# Patient Record
Sex: Female | Born: 1945
Health system: Southern US, Community
[De-identification: ages and names within clinical notes are randomized; demographics above are authoritative.]

## PROBLEM LIST (undated history)

## (undated) DIAGNOSIS — E785 Hyperlipidemia, unspecified: Secondary | ICD-10-CM

## (undated) DIAGNOSIS — IMO0001 Reserved for inherently not codable concepts without codable children: Secondary | ICD-10-CM

## (undated) DIAGNOSIS — E041 Nontoxic single thyroid nodule: Secondary | ICD-10-CM

## (undated) DIAGNOSIS — S069XAA Unspecified intracranial injury with loss of consciousness status unknown, initial encounter: Secondary | ICD-10-CM

## (undated) DIAGNOSIS — K219 Gastro-esophageal reflux disease without esophagitis: Secondary | ICD-10-CM

## (undated) DIAGNOSIS — E039 Hypothyroidism, unspecified: Secondary | ICD-10-CM

## (undated) DIAGNOSIS — F329 Major depressive disorder, single episode, unspecified: Secondary | ICD-10-CM

## (undated) DIAGNOSIS — F419 Anxiety disorder, unspecified: Secondary | ICD-10-CM

## (undated) DIAGNOSIS — Z820 Family history of epilepsy and other diseases of the nervous system: Secondary | ICD-10-CM

## (undated) DIAGNOSIS — J302 Other seasonal allergic rhinitis: Secondary | ICD-10-CM

## (undated) DIAGNOSIS — S069X9A Unspecified intracranial injury with loss of consciousness of unspecified duration, initial encounter: Secondary | ICD-10-CM

## (undated) DIAGNOSIS — J209 Acute bronchitis, unspecified: Secondary | ICD-10-CM

## (undated) DIAGNOSIS — Z9109 Other allergy status, other than to drugs and biological substances: Secondary | ICD-10-CM

## (undated) DIAGNOSIS — R0602 Shortness of breath: Secondary | ICD-10-CM

## (undated) DIAGNOSIS — F32A Depression, unspecified: Secondary | ICD-10-CM

## (undated) HISTORY — DX: Family history of epilepsy and other diseases of the nervous system: Z82.0

## (undated) HISTORY — DX: Hyperlipidemia, unspecified: E78.5

## (undated) HISTORY — DX: Other seasonal allergic rhinitis: J30.2

## (undated) HISTORY — DX: Depression, unspecified: F32.A

## (undated) HISTORY — DX: Reserved for inherently not codable concepts without codable children: IMO0001

## (undated) HISTORY — DX: Unspecified intracranial injury with loss of consciousness status unknown, initial encounter: S06.9XAA

## (undated) HISTORY — DX: Major depressive disorder, single episode, unspecified: F32.9

## (undated) HISTORY — PX: OTHER SURGICAL HISTORY: SHX169

## (undated) HISTORY — DX: Nontoxic single thyroid nodule: E04.1

## (undated) HISTORY — DX: Anxiety disorder, unspecified: F41.9

## (undated) HISTORY — PX: NASAL SINUS SURGERY: SHX719

## (undated) HISTORY — DX: Unspecified intracranial injury with loss of consciousness of unspecified duration, initial encounter: S06.9X9A

## (undated) HISTORY — DX: Hypothyroidism, unspecified: E03.9

## (undated) HISTORY — PX: TUBAL LIGATION: SHX77

---

## 1998-11-30 ENCOUNTER — Ambulatory Visit (HOSPITAL_COMMUNITY): Admission: RE | Admit: 1998-11-30 | Discharge: 1998-11-30 | Payer: Self-pay | Admitting: *Deleted

## 1998-11-30 ENCOUNTER — Encounter: Payer: Self-pay | Admitting: *Deleted

## 2000-01-27 ENCOUNTER — Ambulatory Visit (HOSPITAL_COMMUNITY): Admission: RE | Admit: 2000-01-27 | Discharge: 2000-01-27 | Payer: Self-pay | Admitting: *Deleted

## 2000-01-27 ENCOUNTER — Encounter: Payer: Self-pay | Admitting: *Deleted

## 2000-02-28 ENCOUNTER — Other Ambulatory Visit: Admission: RE | Admit: 2000-02-28 | Discharge: 2000-02-28 | Payer: Self-pay | Admitting: *Deleted

## 2001-03-05 ENCOUNTER — Encounter: Payer: Self-pay | Admitting: *Deleted

## 2001-03-05 ENCOUNTER — Ambulatory Visit (HOSPITAL_COMMUNITY): Admission: RE | Admit: 2001-03-05 | Discharge: 2001-03-05 | Payer: Self-pay | Admitting: *Deleted

## 2001-03-10 ENCOUNTER — Encounter: Admission: RE | Admit: 2001-03-10 | Discharge: 2001-03-10 | Payer: Self-pay | Admitting: *Deleted

## 2001-03-10 ENCOUNTER — Encounter: Payer: Self-pay | Admitting: *Deleted

## 2002-03-08 ENCOUNTER — Encounter: Admission: RE | Admit: 2002-03-08 | Discharge: 2002-03-08 | Payer: Self-pay | Admitting: *Deleted

## 2002-03-08 ENCOUNTER — Encounter: Payer: Self-pay | Admitting: *Deleted

## 2003-04-14 ENCOUNTER — Encounter: Payer: Self-pay | Admitting: *Deleted

## 2003-04-14 ENCOUNTER — Encounter: Admission: RE | Admit: 2003-04-14 | Discharge: 2003-04-14 | Payer: Self-pay | Admitting: *Deleted

## 2003-04-21 ENCOUNTER — Encounter: Payer: Self-pay | Admitting: *Deleted

## 2003-04-21 ENCOUNTER — Ambulatory Visit (HOSPITAL_COMMUNITY): Admission: RE | Admit: 2003-04-21 | Discharge: 2003-04-21 | Payer: Self-pay | Admitting: *Deleted

## 2005-06-09 ENCOUNTER — Ambulatory Visit (HOSPITAL_COMMUNITY): Admission: RE | Admit: 2005-06-09 | Discharge: 2005-06-09 | Payer: Self-pay | Admitting: *Deleted

## 2005-07-17 ENCOUNTER — Other Ambulatory Visit: Admission: RE | Admit: 2005-07-17 | Discharge: 2005-07-17 | Payer: Self-pay | Admitting: Obstetrics and Gynecology

## 2006-04-02 ENCOUNTER — Ambulatory Visit: Payer: Self-pay | Admitting: Internal Medicine

## 2006-04-09 ENCOUNTER — Encounter (INDEPENDENT_AMBULATORY_CARE_PROVIDER_SITE_OTHER): Payer: Self-pay | Admitting: Specialist

## 2006-04-09 ENCOUNTER — Ambulatory Visit: Payer: Self-pay | Admitting: Internal Medicine

## 2006-04-16 ENCOUNTER — Ambulatory Visit: Payer: Self-pay | Admitting: Pulmonary Disease

## 2006-04-17 ENCOUNTER — Ambulatory Visit: Payer: Self-pay | Admitting: Cardiology

## 2006-05-27 ENCOUNTER — Ambulatory Visit: Payer: Self-pay | Admitting: Internal Medicine

## 2006-06-05 ENCOUNTER — Ambulatory Visit: Payer: Self-pay | Admitting: Pulmonary Disease

## 2009-04-24 ENCOUNTER — Ambulatory Visit (HOSPITAL_COMMUNITY): Admission: RE | Admit: 2009-04-24 | Discharge: 2009-04-24 | Payer: Self-pay | Admitting: Obstetrics & Gynecology

## 2009-06-02 ENCOUNTER — Emergency Department (HOSPITAL_COMMUNITY): Admission: EM | Admit: 2009-06-02 | Discharge: 2009-06-02 | Payer: Self-pay | Admitting: Emergency Medicine

## 2010-12-20 LAB — BASIC METABOLIC PANEL
CO2: 23 mEq/L (ref 19–32)
Chloride: 104 mEq/L (ref 96–112)
GFR calc Af Amer: >60 mL/min (ref >60)
Glucose, Bld: 103 mg/dL — ABNORMAL HIGH (ref 70–99)
Potassium: 3.9 mEq/L (ref 3.5–5.1)
Sodium: 138 mEq/L (ref 135–145)

## 2010-12-20 LAB — DIFFERENTIAL
Basophils Absolute: 0 10*3/uL (ref 0.0–0.1)
Eosinophils Relative: 3 % (ref 0–5)
Lymphocytes Relative: 24 % (ref 12–46)
Lymphs Abs: 2.6 10*3/uL (ref 0.7–4.0)
Monocytes Absolute: 0.7 10*3/uL (ref 0.1–1.0)
Monocytes Relative: 6 % (ref 3–12)
Neutro Abs: 6.9 10*3/uL (ref 1.7–7.7)

## 2010-12-20 LAB — CBC
HCT: 44.8 % (ref 36.0–46.0)
Hemoglobin: 15.5 g/dL — ABNORMAL HIGH (ref 12.0–15.0)
MCHC: 34.7 g/dL (ref 30.0–36.0)
MCV: 93.5 fL (ref 78.0–100.0)
RBC: 4.8 MIL/uL (ref 3.87–5.11)
RDW: 12.5 % (ref 11.5–15.5)

## 2010-12-20 LAB — POCT CARDIAC MARKERS
CKMB, poc: <1 ng/mL — ABNORMAL LOW (ref 1.0–8.0)
Myoglobin, poc: 41.7 ng/mL (ref 12–200)
Myoglobin, poc: 56.1 ng/mL (ref 12–200)
Troponin i, poc: <0.05 ng/mL (ref 0.00–0.09)
Troponin i, poc: <0.05 ng/mL (ref 0.00–0.09)

## 2010-12-20 LAB — URINALYSIS, ROUTINE W REFLEX MICROSCOPIC
Bilirubin Urine: NEGATIVE
Nitrite: POSITIVE — AB
Protein, ur: NEGATIVE mg/dL
Urobilinogen, UA: 0.2 mg/dL (ref 0.0–1.0)

## 2010-12-20 LAB — CK TOTAL AND CKMB (NOT AT ARMC): Total CK: 66 U/L (ref 7–177)

## 2010-12-20 LAB — URINE MICROSCOPIC-ADD ON

## 2011-01-31 NOTE — Assessment & Plan Note (Signed)
[facility]                               PULMONARY OFFICE NOTE   NAME:[name], [name]                    MRN:          [id number]  DATE:04/16/2006                            DOB:          1946/02/14    REASON FOR CONSULTATION:  Cough.   This is a very pleasant 65 year old female kindly referred by Dr. Lina Sar because of cough, which was believed to be secondary to  gastroesophageal reflux.  Dr. Juanda Chance had done extensive workup to include  panendoscopy.  This was done on April 09, 2006.  The patient was noted to  have changes consistent with gastroesophageal reflux on endoscopic  examination.  The patient was placed on Protonix twice a day.  Since that  time, the patient states that she has noted dramatic improvement of her  cough.  She first noticed her cough around March of 2007 and noted that  things like warm tea and cough drops would make it better.  She also noted  that the cough happened at any time, but that anything can set it off and  that sometimes when she starts coughing, it is very difficult to control,  but again, since her endoscopy in July and since her Protonix was started  twice a day the patient has noted much improvement in her symptomatology.   The patient has seen Dr. Osborn Coho in the past for ear, nose, and  throat difficulties.  She has been diagnosed with allergies for  approximately 20-30 years.   PAST MEDICAL HISTORY:  Includes that of nasal polyps.  She has  gastroesophageal reflux as noted above.  She also has difficulties with  chronic environmental allergies for which she has been previously in  immunotherapy for.   PAST SURGICAL HISTORY:  Includes hysterectomy in 1996, tubal ligation in  1987.  Ovarian cyst removal in 1970.  She also has had manipulation of sinus  polyps, but no inpatient surgery for the same.   ALLERGIES:  None noted.  She has taken prednisone in the past for her  allergic  symptoms.  Last was in 1994.   CURRENT MEDICATIONS:  1. Lexapro 20 mg half tablet daily.  2. Estrace cream 2 times per week.  3. She is on Protonix 40 mg twice a day.  4. She uses also Gas Relief p.r.n.  5. The patient also takes Fioricet as needed.  6. Allegra as needed.  7. Tussionex as needed.  8. She is also on Flonase nasal spray.   SOCIAL HISTORY:  The patient has never smoked.  She lives with her husband.  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  She has had no exotic travel.   FAMILY HISTORY:  Noncontributory.   REVIEW OF SYSTEMS:  As noted in the history of present illness.  She does  have symptoms of anxiety and depression on occasion.  Otherwise, review of  systems is unremarkable.   PHYSICAL EXAMINATION:  Blood pressure is 100/60, pulse of 75, weight 141.5,  temperature 98.0.  Oxygen saturation of 97% on room air.  GENERAL:  This is a well-developed, well-nourished white female who is  ambulatory.  HEENT:  Examination is remarkable for nasal turbinate edema.  She has clear  nasal discharge with some crusting noted.  Pharynx is clear.  NECK:  Supple.  No adenopathy noted.  No JVD.  LUNGS:  Clear to auscultation bilaterally.  CARDIAC:  Regular rate and rhythm.  No rubs, murmurs, or gallops heard.  EXTREMITIES:  She has no cyanosis, clubbing, or edema noted.   We did perform spirometry, which showed that she basically had normal  spirometry with the exception of a very, very mild small airway component,  but this is almost insignificant.   IMPRESSION:  1. Cough, which I agree with Dr. Juanda Chance, likely is due to gastroesophageal      reflux with laryngopharyngeal component.  I do suspect, however, that      she has an element of chronic sinus disease and possibly some post-      nasal drip issues that may be aggravating her condition.  2. The patient has allergic rhinitis.  3. Gastroesophageal reflux with  laryngopharyngeal component as well.   PLAN:  1. We will give the patient a trial of Singulair 10 mg daily to see if      this helps with her chronic nasal symptoms.  2. Change Flonase to Veramyst 2 inhalations to each nostril daily.  3. Continue Protonix on a twice a day basis.  4. I will obtain a CT scan of the sinuses.  5. Followup will be in 4 to 6 weeks' time.  She is to contact us prior to      that time should any problems arise.                                   Gailen Shelter, MD   CLG/MedQ  DD:  06/05/2006  DT:  06/08/2006  Job #:  [id number]   cc:   Hedwig Morton. Juanda Chance, MD  Kinnie Scales Annalee Genta, M.D.

## 2011-01-31 NOTE — Assessment & Plan Note (Signed)
[facility]                               PULMONARY OFFICE NOTE   NAME:[name], [name]                    MRN:          [id number]  DATE:06/05/2006                            DOB:          06-12-1946    [name] is a very pleasant 65 year old female who follows here for  cough, first noticed around March of 2007 with subsequent endoscopic  evaluation by [name] in July of 2007, which was consistent with  gastroesophageal reflux.  The patient also has chronic nasal congestion.  The patient underwent a CT scan of the sinuses on April 17, 2006 and she was  noted to have severe pansinusitis and nasal polyps.  She presents today with  difficulties with frontal headaches for the last 2 days, which she is  calling a migraine.  She does state that her cough continues to do well.  Her Protonix has since been decreased to once daily by [name].  She has  had, however, some increased nasal congestion and mucopurulent discharge.  She denies any fevers, chills, or sweats.  By her description, her headaches  do not sound like a classic migraine, but I suspect are related to sinus  disease.  Given the severity of her disease on her CT scan, I am not  surprised that she is having a flare of sinusitis.   CURRENT MEDICATIONS:  As noted on the intake sheet.  These have been  appropriately inventoried and are correct.   PHYSICAL EXAM:  VITAL SIGNS:  As noted.  Oxygen saturation is 96% on room  air.  GENERAL:  This is a thin well-developed female who is in no acute distress.  She does sound quite nasally congested.  HEENT:  Examination reveals severe turbinate edema.  She has crusting about  the nares and purulent drainage.  She also has frontal sinus tenderness.  Tympanic membranes reveal serous otitis bilaterally.  Pharynx is clear.  LUNGS:  Clear.  CARDIAC:  Regular rate and rhythm.  No rubs, murmurs, or gallops heard.  EXTREMITIES:  No cyanosis,  clubbing, or edema noted.   IMPRESSION:  1. Acute on chronic exacerbation of chronic sinusitis.  The patient      appears to have active sinus infection at present.  2. Cough, which is improved and in part due to gastroesophageal reflux,      and part due to chronic nasal issues.   PLAN:  1. Will be to place the patient on Levaquin 750 mg daily x7 days.  2. The patient will be referred to [name] to evaluate her      chronic sinus disease.  3. She is to continue nasal hygiene, continue Veramyst, and start also      nasal saline irrigations.  4. Followup will be in 2 to 4 weeks' time.  She is to contact us prior to      that time should any problems arise.  Adriana Shelter, MD   CLG/MedQ  DD:  06/05/2006  DT:  06/08/2006  Job #:  [id number]   cc:   Adriana Hua L. Annalee [name], M.D.

## 2011-01-31 NOTE — Assessment & Plan Note (Signed)
[facility]                           GASTROENTEROLOGY OFFICE NOTE   NAME:[name], [name]                    MRN:          [id number]  DATE:04/02/2006                            DOB:          [birth year]    [name] is a very nice 65 year old white female. She is a Engineer, civil (consulting) with  [employer]. She has a chronic cough which bothers her during the  day and mostly at night.  She thinks this is gastroesophageal reflux based  on the evaluation by Dr. Annalee Genta about two years ago when she was  evaluated with sinus trouble.  He looked down her throat and told her that  she had changes of gastroesophageal reflux. She was put on Nexium 40 mg a  day which she took for about a year until her insurance denied it.  She  since then has been on Prilosec OTC 20 mg once or twice a day but has not  been satisfied with the results.  She denies any dysphagia or odynophagia.  Her weight has been stable.  She never smoked.  She had a chest x-ray last  month which apparently was normal.  She denies shortness of breath.  The  cough is not productive or productive of clear mucus.  The cough has  affected her at work.  The patient thinks that she may be having infection  and it bothers her.  She also has a lot of gas especially when she coughs.  She has no control over the flatus and that seems to be a problem as well.  The patient has mild constipation.  This was evaluated by Dr. Sherin Quarry  in  1998 when she underwent colonoscopy.  No polyps were found.  Her brother as  well as her son both have polyps. The son had a premalignant polyp.  She is  due for a repeat colonoscopy.   MEDICATIONS:  1.  Lexapro 20 mg p.o. daily.  2.  Estrace cream.  3.  Prilosec 20 mg OTC.  4.  She is also on Fioricet p.r.n.  5.  Allegra.  6.  Claritin.  7.  Tylenol ES.  8.  Cough drops.   PAST HISTORY:  1.  Significant for depression for three years.  2.  Allergy trouble.  3.   Migraine headaches.  4.  Anxiety.   OPERATIONS:  1.  Ovary and cyst removed.  2.  Sinus polyps removed by Dr. Annalee Genta.  3.  Tubal ligation.  4.  Hysterectomy in 1996.   FAMILY HISTORY:  Positive for heart disease in mother who also had a stroke.  Father had prostate cancer.   SOCIAL HISTORY:  She is married, having two children.  Has two years of  college. Works in physical therapy.  She does not smoke and does not drink  alcohol.   REVIEW OF SYSTEMS:  Positive for eyeglasses, allergies, arthritis, coughing,  leakage of urine, hearing problems.   PHYSICAL EXAMINATION:  VITAL SIGNS:  Blood pressure 112/58, pulse 78, weight  142 pounds.  GENERAL:  She was alert, oriented, in no distress.  Her voice was somewhat  hoarse or raspy.  She coughed every two or three minutes, a productive  cough, rather deep with audible rales.  HEENT:  Sclerae anicteric.  Oral cavity was normal with no thrush.  NECK:  Supple.  Thyroid not enlarged.  No adenopathy.  LUNGS:  Clear to auscultation.  There were no rales or wheezes although she  complains of occasional wheezing.  HEART:  Right precordium.  Normal S1 and S2.  ABDOMEN:  Soft, nontender with normoactive bowel sounds.  No distention, no  tympany.  Lower abdomen also unremarkable.  RECTAL:  Soft, Hemoccult-  negative stool.  EXTREMITIES:  No edema.   IMPRESSION:  65.  A 65 year old black female with productive cough and previous history of      gastroesophageal reflux disease.  The cough needs to be further      evaluated.  If it is due to reflux, she needs to be treated      aggressively.  If it is not due to reflux, she needs to see pulmonary to      rule out bronchiectasis or possibly some other pulmonary condition.  2.  Gastroesophageal reflux by history.  3.  Constipation.  Previous colonoscopy 10 years ago.  The patient due for      repeat colonoscopy because of family history of colon polyps in son and      father.   PLAN:  1.   Begin Protonix 40 mg p.o. b.i.d.  Samples given.  2.  Tussionex one teaspoon at bedtime, half teaspoon in the morning, to try      to suppress the cough.  3.  Refer to pulmonary for further evaluation.  4.  Upper endoscopy and colonoscopy scheduled.  The patient should stay on      antireflux measures.  She may even need further evaluation of her reflux      and send her for a pH probe but I am going to wait at this time until I      see her in six weeks.                                   [name]. Juanda Chance, MD   DMB/MedQ  DD:  04/02/2006  DT:  04/02/2006  Job #:  [id number]   cc:   [facility]

## 2011-01-31 NOTE — Assessment & Plan Note (Signed)
[facility]                           GASTROENTEROLOGY OFFICE NOTE   NAME:Glendening, Adriana GOODWILL                    MRN:          [id number]  DATE:05/27/2006                            DOB:          12-22-1945    [name] is a 65 year old white female with history of a cough,  hoarseness, and suspected gastroesophageal reflux disease.  On upper  endoscopy, her biopsies of the GE junction were completely normal.  There  was no Barrett's esophagus or esophagitis, although endoscopically there was  a question of mild irritation.  Histologically, there was no esophagitis.  The colonoscopy was negative.  She is about 80-90% improved after taking  Protonix 40 mg twice a day and Tussionex at night to curb her cough.  She  also saw Dr. Jayme Cloud, who obtained CT scan of her sinuses, and she has a  follow-up appointment with her this week to make a conclusion and final  disposition.  The patient is much improved.   PLAN:  We have discussed gastroesophageal reflux and I have suggested she  cut back her Protonix to 40 mg a day, and continue on Tussionex 1 teaspoon  at bedtime with plans to decrease it to p.r.n. dose only.  I will see her in  about 3 months.                                   Hedwig Morton. Juanda Chance, MD   DMB/MedQ  DD:  05/27/2006  DT:  05/27/2006  Job #:  [id number]   cc:   [facility]

## 2011-03-31 ENCOUNTER — Emergency Department (HOSPITAL_COMMUNITY): Payer: No Typology Code available for payment source

## 2011-03-31 ENCOUNTER — Inpatient Hospital Stay (HOSPITAL_COMMUNITY)
Admission: EM | Admit: 2011-03-31 | Discharge: 2011-04-14 | DRG: 957 | Disposition: A | Discharge status: Rehabilitation Facility | Payer: No Typology Code available for payment source | Attending: General Surgery | Admitting: General Surgery

## 2011-03-31 DIAGNOSIS — I712 Thoracic aortic aneurysm, without rupture, unspecified: Secondary | ICD-10-CM | POA: Diagnosis present

## 2011-03-31 DIAGNOSIS — S066X9A Traumatic subarachnoid hemorrhage with loss of consciousness of unspecified duration, initial encounter: Secondary | ICD-10-CM

## 2011-03-31 DIAGNOSIS — E7800 Pure hypercholesterolemia, unspecified: Secondary | ICD-10-CM | POA: Diagnosis present

## 2011-03-31 DIAGNOSIS — E876 Hypokalemia: Secondary | ICD-10-CM | POA: Diagnosis not present

## 2011-03-31 DIAGNOSIS — H8110 Benign paroxysmal vertigo, unspecified ear: Secondary | ICD-10-CM | POA: Diagnosis present

## 2011-03-31 DIAGNOSIS — S42009A Fracture of unspecified part of unspecified clavicle, initial encounter for closed fracture: Secondary | ICD-10-CM

## 2011-03-31 DIAGNOSIS — J9819 Other pulmonary collapse: Secondary | ICD-10-CM | POA: Diagnosis present

## 2011-03-31 DIAGNOSIS — S37009A Unspecified injury of unspecified kidney, initial encounter: Secondary | ICD-10-CM

## 2011-03-31 DIAGNOSIS — E039 Hypothyroidism, unspecified: Secondary | ICD-10-CM | POA: Diagnosis present

## 2011-03-31 DIAGNOSIS — S42023A Displaced fracture of shaft of unspecified clavicle, initial encounter for closed fracture: Secondary | ICD-10-CM | POA: Diagnosis present

## 2011-03-31 DIAGNOSIS — D62 Acute posthemorrhagic anemia: Secondary | ICD-10-CM | POA: Diagnosis present

## 2011-03-31 DIAGNOSIS — D72829 Elevated white blood cell count, unspecified: Secondary | ICD-10-CM | POA: Diagnosis present

## 2011-03-31 DIAGNOSIS — S82209A Unspecified fracture of shaft of unspecified tibia, initial encounter for closed fracture: Principal | ICD-10-CM | POA: Diagnosis present

## 2011-03-31 DIAGNOSIS — D696 Thrombocytopenia, unspecified: Secondary | ICD-10-CM | POA: Diagnosis not present

## 2011-03-31 DIAGNOSIS — S066X0A Traumatic subarachnoid hemorrhage without loss of consciousness, initial encounter: Secondary | ICD-10-CM | POA: Diagnosis present

## 2011-03-31 DIAGNOSIS — J189 Pneumonia, unspecified organism: Secondary | ICD-10-CM | POA: Diagnosis not present

## 2011-03-31 DIAGNOSIS — R21 Rash and other nonspecific skin eruption: Secondary | ICD-10-CM | POA: Diagnosis present

## 2011-03-31 DIAGNOSIS — S62123A Displaced fracture of lunate [semilunar], unspecified wrist, initial encounter for closed fracture: Secondary | ICD-10-CM | POA: Diagnosis present

## 2011-03-31 DIAGNOSIS — S82899A Other fracture of unspecified lower leg, initial encounter for closed fracture: Secondary | ICD-10-CM | POA: Diagnosis present

## 2011-03-31 DIAGNOSIS — Q619 Cystic kidney disease, unspecified: Secondary | ICD-10-CM

## 2011-03-31 DIAGNOSIS — J441 Chronic obstructive pulmonary disease with (acute) exacerbation: Secondary | ICD-10-CM | POA: Diagnosis not present

## 2011-03-31 DIAGNOSIS — S0100XA Unspecified open wound of scalp, initial encounter: Secondary | ICD-10-CM | POA: Diagnosis present

## 2011-03-31 DIAGNOSIS — H409 Unspecified glaucoma: Secondary | ICD-10-CM | POA: Diagnosis present

## 2011-03-31 LAB — URINALYSIS, ROUTINE W REFLEX MICROSCOPIC
Bilirubin Urine: NEGATIVE
Glucose, UA: 250 mg/dL — AB
Protein, ur: NEGATIVE mg/dL
Urobilinogen, UA: 0.2 mg/dL (ref 0.0–1.0)

## 2011-03-31 LAB — CBC
HCT: 45.1 % (ref 36.0–46.0)
HCT: 37.5 % (ref 36.0–46.0)
Hemoglobin: 15.7 g/dL — ABNORMAL HIGH (ref 12.0–15.0)
MCH: 32.2 pg (ref 26.0–34.0)
MCHC: 34.8 g/dL (ref 30.0–36.0)
MCV: 92.6 fL (ref 78.0–100.0)
MCV: 93.5 fL (ref 78.0–100.0)
Platelets: 112 K/uL — ABNORMAL LOW (ref 150–400)
Platelets: 106 10*3/uL — ABNORMAL LOW (ref 150–400)
RBC: 4.87 MIL/uL (ref 3.87–5.11)
RBC: 4.01 MIL/uL (ref 3.87–5.11)
RDW: 12.8 % (ref 11.5–15.5)
RDW: 13 % (ref 11.5–15.5)
WBC: 11.7 K/uL — ABNORMAL HIGH (ref 4.0–10.5)
WBC: 9.3 10*3/uL (ref 4.0–10.5)

## 2011-03-31 LAB — BASIC METABOLIC PANEL WITH GFR
BUN: 11 mg/dL (ref 6–23)
CO2: 22 meq/L (ref 19–32)
Calcium: 8.8 mg/dL (ref 8.4–10.5)
Chloride: 104 meq/L (ref 96–112)
Creatinine, Ser: 0.94 mg/dL (ref 0.50–1.10)
GFR calc Af Amer: >60 mL/min
GFR calc non Af Amer: 60 mL/min — ABNORMAL LOW
Glucose, Bld: 122 mg/dL — ABNORMAL HIGH (ref 70–99)
Potassium: 4 meq/L (ref 3.5–5.1)
Sodium: 139 meq/L (ref 135–145)

## 2011-03-31 LAB — URINE MICROSCOPIC-ADD ON

## 2011-03-31 LAB — DIFFERENTIAL
Basophils Relative: 0 % (ref 0–1)
Eosinophils Absolute: 0 10*3/uL (ref 0.0–0.7)
Neutro Abs: 8.6 10*3/uL — ABNORMAL HIGH (ref 1.7–7.7)
Neutrophils Relative %: 74 % (ref 43–77)

## 2011-03-31 LAB — MRSA PCR SCREENING: MRSA by PCR: NEGATIVE

## 2011-03-31 LAB — PROTIME-INR: INR: 1.08 (ref 0.00–1.49)

## 2011-03-31 MED ORDER — IOHEXOL 300 MG/ML  SOLN
100.0000 mL | Freq: Once | INTRAMUSCULAR | Status: AC | PRN
  Administered 2011-03-31: 100 mL via INTRAVENOUS

## 2011-03-31 MED ORDER — IOHEXOL 300 MG/ML  SOLN
200.0000 mL | Freq: Once | INTRAMUSCULAR | Status: AC | PRN
  Administered 2011-03-31: 70 mL via INTRAVENOUS

## 2011-04-01 ENCOUNTER — Inpatient Hospital Stay (HOSPITAL_COMMUNITY): Payer: No Typology Code available for payment source

## 2011-04-01 LAB — CBC
HCT: 34.5 % — ABNORMAL LOW (ref 36.0–46.0)
HCT: 32.6 % — ABNORMAL LOW (ref 36.0–46.0)
Hemoglobin: 11.7 g/dL — ABNORMAL LOW (ref 12.0–15.0)
Hemoglobin: 10.9 g/dL — ABNORMAL LOW (ref 12.0–15.0)
MCH: 31.6 pg (ref 26.0–34.0)
MCHC: 33.9 g/dL (ref 30.0–36.0)
MCV: 94.5 fL (ref 78.0–100.0)
RBC: 3.45 MIL/uL — ABNORMAL LOW (ref 3.87–5.11)
RDW: 13.2 % (ref 11.5–15.5)
WBC: 7.5 10*3/uL (ref 4.0–10.5)
WBC: 6.9 10*3/uL (ref 4.0–10.5)

## 2011-04-01 LAB — COMPREHENSIVE METABOLIC PANEL
AST: 28 U/L (ref 0–37)
Albumin: 2.6 g/dL — ABNORMAL LOW (ref 3.5–5.2)
BUN: 9 mg/dL (ref 6–23)
Creatinine, Ser: 0.72 mg/dL (ref 0.50–1.10)
Total Protein: 5.1 g/dL — ABNORMAL LOW (ref 6.0–8.3)

## 2011-04-01 LAB — APTT: aPTT: 22 seconds — ABNORMAL LOW (ref 24–37)

## 2011-04-01 LAB — PROTIME-INR
INR: 1.09 (ref 0.00–1.49)
Prothrombin Time: 14.3 seconds (ref 11.6–15.2)
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
## 2011-04-02 ENCOUNTER — Inpatient Hospital Stay (HOSPITAL_COMMUNITY): Payer: No Typology Code available for payment source

## 2011-04-02 LAB — TYPE AND SCREEN
ABO/RH(D): B POS
Antibody Screen: NEGATIVE
Unit division: 0

## 2011-04-02 LAB — CBC
HCT: 29.5 % — ABNORMAL LOW (ref 36.0–46.0)
Hemoglobin: 10.1 g/dL — ABNORMAL LOW (ref 12.0–15.0)
MCH: 31.9 pg (ref 26.0–34.0)
MCHC: 34.2 g/dL (ref 30.0–36.0)
MCV: 93.1 fL (ref 78.0–100.0)
RDW: 13.2 % (ref 11.5–15.5)

## 2011-04-02 LAB — BLOOD GAS, ARTERIAL
Acid-base deficit: 3.7 mmol/L — ABNORMAL HIGH (ref 0.0–2.0)
Bicarbonate: 20.5 mEq/L (ref 20.0–24.0)
Patient temperature: 98.6
TCO2: 21.6 mmol/L (ref 0–100)
pH, Arterial: 7.379 (ref 7.350–7.400)

## 2011-04-02 LAB — BASIC METABOLIC PANEL
BUN: 4 mg/dL — ABNORMAL LOW (ref 6–23)
Creatinine, Ser: 0.59 mg/dL (ref 0.50–1.10)
GFR calc non Af Amer: >60 mL/min (ref >60)
Glucose, Bld: 119 mg/dL — ABNORMAL HIGH (ref 70–99)
Potassium: 3.3 mEq/L — ABNORMAL LOW (ref 3.5–5.1)

## 2011-04-03 ENCOUNTER — Inpatient Hospital Stay (HOSPITAL_COMMUNITY): Payer: No Typology Code available for payment source

## 2011-04-03 LAB — CBC
HCT: 29 % — ABNORMAL LOW (ref 36.0–46.0)
Hemoglobin: 9.9 g/dL — ABNORMAL LOW (ref 12.0–15.0)
MCHC: 34.1 g/dL (ref 30.0–36.0)
MCV: 92.1 fL (ref 78.0–100.0)
WBC: 5.8 10*3/uL (ref 4.0–10.5)

## 2011-04-03 LAB — BASIC METABOLIC PANEL
BUN: 4 mg/dL — ABNORMAL LOW (ref 6–23)
Chloride: 110 mEq/L (ref 96–112)
Glucose, Bld: 112 mg/dL — ABNORMAL HIGH (ref 70–99)
Potassium: 3.2 mEq/L — ABNORMAL LOW (ref 3.5–5.1)
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
## 2011-04-04 ENCOUNTER — Inpatient Hospital Stay (HOSPITAL_COMMUNITY): Payer: No Typology Code available for payment source

## 2011-04-04 LAB — CBC
MCH: 31.2 pg (ref 26.0–34.0)
MCHC: 34.3 g/dL (ref 30.0–36.0)
Platelets: 102 10*3/uL — ABNORMAL LOW (ref 150–400)

## 2011-04-04 LAB — BASIC METABOLIC PANEL
Calcium: 7.8 mg/dL — ABNORMAL LOW (ref 8.4–10.5)
GFR calc Af Amer: >60 mL/min (ref >60)
GFR calc non Af Amer: >60 mL/min (ref >60)
Sodium: 139 mEq/L (ref 135–145)

## 2011-04-05 ENCOUNTER — Inpatient Hospital Stay (HOSPITAL_COMMUNITY): Payer: No Typology Code available for payment source

## 2011-04-05 HISTORY — PX: OTHER SURGICAL HISTORY: SHX169

## 2011-04-05 LAB — BASIC METABOLIC PANEL
CO2: 24 mEq/L (ref 19–32)
Calcium: 7.8 mg/dL — ABNORMAL LOW (ref 8.4–10.5)
Creatinine, Ser: 0.61 mg/dL (ref 0.50–1.10)

## 2011-04-05 LAB — CBC
MCH: 31.2 pg (ref 26.0–34.0)
MCV: 89.4 fL (ref 78.0–100.0)
Platelets: 127 10*3/uL — ABNORMAL LOW (ref 150–400)
RBC: 3.01 MIL/uL — ABNORMAL LOW (ref 3.87–5.11)
RDW: 12.9 % (ref 11.5–15.5)

## 2011-04-05 LAB — TYPE AND SCREEN
ABO/RH(D): B POS
Antibody Screen: NEGATIVE
Unit division: 0

## 2011-04-06 LAB — BASIC METABOLIC PANEL
Calcium: 7.8 mg/dL — ABNORMAL LOW (ref 8.4–10.5)
Creatinine, Ser: 0.54 mg/dL (ref 0.50–1.10)
GFR calc non Af Amer: >60 mL/min (ref >60)
Glucose, Bld: 116 mg/dL — ABNORMAL HIGH (ref 70–99)
Sodium: 136 mEq/L (ref 135–145)

## 2011-04-06 LAB — CBC
MCH: 31.2 pg (ref 26.0–34.0)
MCHC: 34.8 g/dL (ref 30.0–36.0)
Platelets: 167 10*3/uL (ref 150–400)
RBC: 2.85 MIL/uL — ABNORMAL LOW (ref 3.87–5.11)
RDW: 12.8 % (ref 11.5–15.5)

## 2011-04-07 ENCOUNTER — Inpatient Hospital Stay (HOSPITAL_COMMUNITY): Payer: No Typology Code available for payment source

## 2011-04-07 LAB — BASIC METABOLIC PANEL
Chloride: 106 mEq/L (ref 96–112)
GFR calc Af Amer: >60 mL/min (ref >60)
GFR calc non Af Amer: >60 mL/min (ref >60)
Potassium: 3.5 mEq/L (ref 3.5–5.1)
Sodium: 138 mEq/L (ref 135–145)

## 2011-04-07 LAB — CBC
Platelets: 206 10*3/uL (ref 150–400)
RDW: 13.1 % (ref 11.5–15.5)
WBC: 10.9 10*3/uL — ABNORMAL HIGH (ref 4.0–10.5)
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
## 2011-04-08 ENCOUNTER — Inpatient Hospital Stay (HOSPITAL_COMMUNITY): Payer: No Typology Code available for payment source

## 2011-04-08 DIAGNOSIS — S82209A Unspecified fracture of shaft of unspecified tibia, initial encounter for closed fracture: Secondary | ICD-10-CM

## 2011-04-08 DIAGNOSIS — S8263XA Displaced fracture of lateral malleolus of unspecified fibula, initial encounter for closed fracture: Secondary | ICD-10-CM

## 2011-04-08 DIAGNOSIS — S42009A Fracture of unspecified part of unspecified clavicle, initial encounter for closed fracture: Secondary | ICD-10-CM

## 2011-04-08 DIAGNOSIS — S069X9A Unspecified intracranial injury with loss of consciousness of unspecified duration, initial encounter: Secondary | ICD-10-CM

## 2011-04-08 MED ORDER — IOHEXOL 300 MG/ML  SOLN
80.0000 mL | Freq: Once | INTRAMUSCULAR | Status: AC | PRN
  Administered 2011-04-08: 80 mL via INTRAVENOUS
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
## 2011-04-10 ENCOUNTER — Inpatient Hospital Stay (HOSPITAL_COMMUNITY): Payer: No Typology Code available for payment source

## 2011-04-10 LAB — URINE MICROSCOPIC-ADD ON

## 2011-04-10 LAB — URINALYSIS, ROUTINE W REFLEX MICROSCOPIC
Hgb urine dipstick: NEGATIVE
Nitrite: NEGATIVE
Specific Gravity, Urine: 1.022 (ref 1.005–1.030)
Urobilinogen, UA: 1 mg/dL (ref 0.0–1.0)
pH: 6 (ref 5.0–8.0)

## 2011-04-10 LAB — BASIC METABOLIC PANEL
BUN: 12 mg/dL (ref 6–23)
Chloride: 103 mEq/L (ref 96–112)
GFR calc Af Amer: >60 mL/min (ref >60)
Glucose, Bld: 96 mg/dL (ref 70–99)
Potassium: 3.2 mEq/L — ABNORMAL LOW (ref 3.5–5.1)
Sodium: 137 mEq/L (ref 135–145)

## 2011-04-10 LAB — CBC
HCT: 28.4 % — ABNORMAL LOW (ref 36.0–46.0)
Hemoglobin: 9.3 g/dL — ABNORMAL LOW (ref 12.0–15.0)
WBC: 14.2 10*3/uL — ABNORMAL HIGH (ref 4.0–10.5)

## 2011-04-11 HISTORY — PX: OTHER SURGICAL HISTORY: SHX169

## 2011-04-11 LAB — BASIC METABOLIC PANEL
Chloride: 107 mEq/L (ref 96–112)
GFR calc non Af Amer: >60 mL/min (ref >60)
Glucose, Bld: 108 mg/dL — ABNORMAL HIGH (ref 70–99)
Potassium: 4.3 mEq/L (ref 3.5–5.1)
Sodium: 139 mEq/L (ref 135–145)

## 2011-04-11 LAB — CBC
Hemoglobin: 8.4 g/dL — ABNORMAL LOW (ref 12.0–15.0)
MCHC: 32.7 g/dL (ref 30.0–36.0)
RBC: 2.77 MIL/uL — ABNORMAL LOW (ref 3.87–5.11)
WBC: 17.1 10*3/uL — ABNORMAL HIGH (ref 4.0–10.5)

## 2011-04-12 LAB — CBC
HCT: 25.1 % — ABNORMAL LOW (ref 36.0–46.0)
Hemoglobin: 8.3 g/dL — ABNORMAL LOW (ref 12.0–15.0)
MCH: 31 pg (ref 26.0–34.0)
MCHC: 33.1 g/dL (ref 30.0–36.0)

## 2011-04-14 ENCOUNTER — Inpatient Hospital Stay (HOSPITAL_COMMUNITY)
Admission: RE | Admit: 2011-04-14 | Discharge: 2011-04-23 | DRG: 945 | Disposition: A | Discharge status: Home / Self Care | Payer: Medicare Other | Source: Other Acute Inpatient Hospital | Attending: Physical Medicine & Rehabilitation | Admitting: Physical Medicine & Rehabilitation

## 2011-04-14 DIAGNOSIS — N390 Urinary tract infection, site not specified: Secondary | ICD-10-CM | POA: Diagnosis present

## 2011-04-14 DIAGNOSIS — Q619 Cystic kidney disease, unspecified: Secondary | ICD-10-CM

## 2011-04-14 DIAGNOSIS — K5900 Constipation, unspecified: Secondary | ICD-10-CM | POA: Diagnosis present

## 2011-04-14 DIAGNOSIS — Z5189 Encounter for other specified aftercare: Secondary | ICD-10-CM

## 2011-04-14 DIAGNOSIS — D62 Acute posthemorrhagic anemia: Secondary | ICD-10-CM | POA: Diagnosis present

## 2011-04-14 DIAGNOSIS — S62123A Displaced fracture of lunate [semilunar], unspecified wrist, initial encounter for closed fracture: Secondary | ICD-10-CM | POA: Diagnosis present

## 2011-04-14 DIAGNOSIS — S42023A Displaced fracture of shaft of unspecified clavicle, initial encounter for closed fracture: Secondary | ICD-10-CM | POA: Diagnosis present

## 2011-04-14 DIAGNOSIS — S82843A Displaced bimalleolar fracture of unspecified lower leg, initial encounter for closed fracture: Secondary | ICD-10-CM

## 2011-04-14 DIAGNOSIS — D696 Thrombocytopenia, unspecified: Secondary | ICD-10-CM | POA: Diagnosis present

## 2011-04-14 DIAGNOSIS — S069X9A Unspecified intracranial injury with loss of consciousness of unspecified duration, initial encounter: Secondary | ICD-10-CM

## 2011-04-14 DIAGNOSIS — Z882 Allergy status to sulfonamides status: Secondary | ICD-10-CM

## 2011-04-14 DIAGNOSIS — S82209A Unspecified fracture of shaft of unspecified tibia, initial encounter for closed fracture: Secondary | ICD-10-CM | POA: Diagnosis present

## 2011-04-14 DIAGNOSIS — S066X0A Traumatic subarachnoid hemorrhage without loss of consciousness, initial encounter: Secondary | ICD-10-CM | POA: Diagnosis present

## 2011-04-14 DIAGNOSIS — H8110 Benign paroxysmal vertigo, unspecified ear: Secondary | ICD-10-CM | POA: Diagnosis present

## 2011-04-14 DIAGNOSIS — F0781 Postconcussional syndrome: Secondary | ICD-10-CM | POA: Diagnosis present

## 2011-04-14 DIAGNOSIS — S42009A Fracture of unspecified part of unspecified clavicle, initial encounter for closed fracture: Secondary | ICD-10-CM

## 2011-04-14 DIAGNOSIS — S82899A Other fracture of unspecified lower leg, initial encounter for closed fracture: Secondary | ICD-10-CM | POA: Diagnosis present

## 2011-04-14 DIAGNOSIS — S62109A Fracture of unspecified carpal bone, unspecified wrist, initial encounter for closed fracture: Secondary | ICD-10-CM

## 2011-04-14 LAB — URINALYSIS, ROUTINE W REFLEX MICROSCOPIC
Glucose, UA: 100 mg/dL — AB
Hgb urine dipstick: NEGATIVE
Ketones, ur: NEGATIVE mg/dL
Protein, ur: NEGATIVE mg/dL

## 2011-04-14 LAB — URINE CULTURE

## 2011-04-15 DIAGNOSIS — S42009A Fracture of unspecified part of unspecified clavicle, initial encounter for closed fracture: Secondary | ICD-10-CM

## 2011-04-15 DIAGNOSIS — S62109A Fracture of unspecified carpal bone, unspecified wrist, initial encounter for closed fracture: Secondary | ICD-10-CM

## 2011-04-15 DIAGNOSIS — S069X9A Unspecified intracranial injury with loss of consciousness of unspecified duration, initial encounter: Secondary | ICD-10-CM

## 2011-04-15 DIAGNOSIS — S82843A Displaced bimalleolar fracture of unspecified lower leg, initial encounter for closed fracture: Secondary | ICD-10-CM

## 2011-04-15 LAB — COMPREHENSIVE METABOLIC PANEL
AST: 26 U/L (ref 0–37)
Albumin: 3 g/dL — ABNORMAL LOW (ref 3.5–5.2)
CO2: 23 mEq/L (ref 19–32)
Calcium: 9 mg/dL (ref 8.4–10.5)
Creatinine, Ser: 0.63 mg/dL (ref 0.50–1.10)
GFR calc Af Amer: >60 mL/min (ref >60)

## 2011-04-15 LAB — CBC
MCH: 30.5 pg (ref 26.0–34.0)
MCV: 95.9 fL (ref 78.0–100.0)
Platelets: 346 10*3/uL (ref 150–400)
RDW: 15 % (ref 11.5–15.5)

## 2011-04-15 LAB — DIFFERENTIAL
Eosinophils Absolute: 0.4 10*3/uL (ref 0.0–0.7)
Eosinophils Relative: 3 % (ref 0–5)
Lymphs Abs: 2.3 10*3/uL (ref 0.7–4.0)
Monocytes Relative: 9 % (ref 3–12)

## 2011-04-15 LAB — URINE CULTURE
Colony Count: NO GROWTH
Culture: NO GROWTH

## 2011-04-16 DIAGNOSIS — S82843A Displaced bimalleolar fracture of unspecified lower leg, initial encounter for closed fracture: Secondary | ICD-10-CM

## 2011-04-16 DIAGNOSIS — S069X9A Unspecified intracranial injury with loss of consciousness of unspecified duration, initial encounter: Secondary | ICD-10-CM

## 2011-04-16 DIAGNOSIS — S42009A Fracture of unspecified part of unspecified clavicle, initial encounter for closed fracture: Secondary | ICD-10-CM

## 2011-04-16 DIAGNOSIS — S62109A Fracture of unspecified carpal bone, unspecified wrist, initial encounter for closed fracture: Secondary | ICD-10-CM

## 2011-04-17 DIAGNOSIS — M79609 Pain in unspecified limb: Secondary | ICD-10-CM
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
## 2011-04-19 DIAGNOSIS — S62109A Fracture of unspecified carpal bone, unspecified wrist, initial encounter for closed fracture: Secondary | ICD-10-CM

## 2011-04-19 DIAGNOSIS — S42009A Fracture of unspecified part of unspecified clavicle, initial encounter for closed fracture: Secondary | ICD-10-CM

## 2011-04-19 DIAGNOSIS — S069X9A Unspecified intracranial injury with loss of consciousness of unspecified duration, initial encounter: Secondary | ICD-10-CM

## 2011-04-19 DIAGNOSIS — S82843A Displaced bimalleolar fracture of unspecified lower leg, initial encounter for closed fracture: Secondary | ICD-10-CM
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
## 2011-04-21 DIAGNOSIS — S42009A Fracture of unspecified part of unspecified clavicle, initial encounter for closed fracture: Secondary | ICD-10-CM

## 2011-04-21 DIAGNOSIS — S62109A Fracture of unspecified carpal bone, unspecified wrist, initial encounter for closed fracture: Secondary | ICD-10-CM

## 2011-04-21 DIAGNOSIS — S82843A Displaced bimalleolar fracture of unspecified lower leg, initial encounter for closed fracture: Secondary | ICD-10-CM

## 2011-04-21 DIAGNOSIS — S069X9A Unspecified intracranial injury with loss of consciousness of unspecified duration, initial encounter: Secondary | ICD-10-CM

## 2011-04-21 LAB — CBC
Hemoglobin: 10.3 g/dL — ABNORMAL LOW (ref 12.0–15.0)
MCV: 98.2 fL (ref 78.0–100.0)
Platelets: 289 10*3/uL (ref 150–400)
RBC: 3.36 MIL/uL — ABNORMAL LOW (ref 3.87–5.11)
WBC: 7 10*3/uL (ref 4.0–10.5)

## 2011-04-23 DIAGNOSIS — S62109A Fracture of unspecified carpal bone, unspecified wrist, initial encounter for closed fracture: Secondary | ICD-10-CM

## 2011-04-23 DIAGNOSIS — S069X9A Unspecified intracranial injury with loss of consciousness of unspecified duration, initial encounter: Secondary | ICD-10-CM

## 2011-04-23 DIAGNOSIS — S82843A Displaced bimalleolar fracture of unspecified lower leg, initial encounter for closed fracture: Secondary | ICD-10-CM

## 2011-04-23 DIAGNOSIS — S42009A Fracture of unspecified part of unspecified clavicle, initial encounter for closed fracture: Secondary | ICD-10-CM

## 2011-04-24 ENCOUNTER — Ambulatory Visit: Payer: Medicare Other | Admitting: Occupational Therapy

## 2011-04-24 ENCOUNTER — Ambulatory Visit: Payer: Medicare Other | Attending: Physical Medicine & Rehabilitation | Admitting: Physical Therapy

## 2011-04-24 DIAGNOSIS — M6281 Muscle weakness (generalized): Secondary | ICD-10-CM | POA: Insufficient documentation

## 2011-04-24 DIAGNOSIS — Z5189 Encounter for other specified aftercare: Secondary | ICD-10-CM | POA: Insufficient documentation

## 2011-04-24 DIAGNOSIS — R269 Unspecified abnormalities of gait and mobility: Secondary | ICD-10-CM | POA: Insufficient documentation

## 2011-04-24 DIAGNOSIS — M2560 Stiffness of unspecified joint, not elsewhere classified: Secondary | ICD-10-CM | POA: Insufficient documentation

## 2011-04-24 DIAGNOSIS — R5381 Other malaise: Secondary | ICD-10-CM | POA: Insufficient documentation

## 2011-04-29 ENCOUNTER — Ambulatory Visit: Payer: Medicare Other | Admitting: Physical Therapy
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
## 2011-05-01 ENCOUNTER — Ambulatory Visit: Payer: Medicare Other | Admitting: Physical Therapy

## 2011-05-05 ENCOUNTER — Ambulatory Visit: Payer: Medicare Other | Admitting: Occupational Therapy

## 2011-05-06 ENCOUNTER — Ambulatory Visit: Payer: Medicare Other

## 2011-05-07 ENCOUNTER — Ambulatory Visit: Payer: Medicare Other | Admitting: Physical Therapy

## 2011-05-08 ENCOUNTER — Ambulatory Visit: Payer: Medicare Other | Admitting: Physical Therapy

## 2011-05-08 ENCOUNTER — Ambulatory Visit: Payer: Medicare Other | Admitting: *Deleted

## 2011-05-12 ENCOUNTER — Ambulatory Visit: Payer: Medicare Other | Admitting: Occupational Therapy

## 2011-05-13 ENCOUNTER — Ambulatory Visit: Payer: Medicare Other | Admitting: Occupational Therapy

## 2011-05-13 ENCOUNTER — Ambulatory Visit: Payer: Medicare Other | Admitting: Physical Therapy

## 2011-05-16 ENCOUNTER — Ambulatory Visit: Payer: Medicare Other | Admitting: Physical Therapy

## 2011-05-21 ENCOUNTER — Ambulatory Visit: Payer: Medicare Other | Admitting: Occupational Therapy

## 2011-05-21 ENCOUNTER — Ambulatory Visit: Payer: Medicare Other | Attending: Physical Medicine & Rehabilitation | Admitting: Physical Therapy

## 2011-05-21 DIAGNOSIS — M2560 Stiffness of unspecified joint, not elsewhere classified: Secondary | ICD-10-CM | POA: Insufficient documentation

## 2011-05-21 DIAGNOSIS — M6281 Muscle weakness (generalized): Secondary | ICD-10-CM | POA: Insufficient documentation

## 2011-05-21 DIAGNOSIS — R269 Unspecified abnormalities of gait and mobility: Secondary | ICD-10-CM | POA: Insufficient documentation

## 2011-05-21 DIAGNOSIS — Z5189 Encounter for other specified aftercare: Secondary | ICD-10-CM | POA: Insufficient documentation

## 2011-05-21 DIAGNOSIS — R5381 Other malaise: Secondary | ICD-10-CM | POA: Insufficient documentation

## 2011-05-23 ENCOUNTER — Ambulatory Visit: Payer: Medicare Other | Admitting: Physical Therapy

## 2011-05-23 ENCOUNTER — Ambulatory Visit: Payer: Medicare Other | Admitting: Occupational Therapy

## 2011-05-27 ENCOUNTER — Ambulatory Visit: Payer: Medicare Other | Admitting: Occupational Therapy

## 2011-05-27 ENCOUNTER — Ambulatory Visit: Payer: Medicare Other | Admitting: Physical Therapy

## 2011-05-28 ENCOUNTER — Encounter: Payer: Medicare Other | Attending: Physical Medicine & Rehabilitation | Admitting: Physical Medicine & Rehabilitation

## 2011-05-28 DIAGNOSIS — S069XAA Unspecified intracranial injury with loss of consciousness status unknown, initial encounter: Secondary | ICD-10-CM | POA: Insufficient documentation

## 2011-05-28 DIAGNOSIS — S42009A Fracture of unspecified part of unspecified clavicle, initial encounter for closed fracture: Secondary | ICD-10-CM

## 2011-05-28 DIAGNOSIS — S82209A Unspecified fracture of shaft of unspecified tibia, initial encounter for closed fracture: Secondary | ICD-10-CM

## 2011-05-28 DIAGNOSIS — S069X9A Unspecified intracranial injury with loss of consciousness of unspecified duration, initial encounter: Secondary | ICD-10-CM

## 2011-05-28 DIAGNOSIS — S8263XA Displaced fracture of lateral malleolus of unspecified fibula, initial encounter for closed fracture: Secondary | ICD-10-CM | POA: Insufficient documentation

## 2011-05-28 DIAGNOSIS — S62109A Fracture of unspecified carpal bone, unspecified wrist, initial encounter for closed fracture: Secondary | ICD-10-CM

## 2011-05-28 DIAGNOSIS — X58XXXA Exposure to other specified factors, initial encounter: Secondary | ICD-10-CM | POA: Insufficient documentation

## 2011-05-28 DIAGNOSIS — S62123A Displaced fracture of lunate [semilunar], unspecified wrist, initial encounter for closed fracture: Secondary | ICD-10-CM | POA: Insufficient documentation
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
## 2011-05-29 ENCOUNTER — Ambulatory Visit: Payer: Medicare Other | Admitting: Physical Therapy

## 2011-05-29 ENCOUNTER — Ambulatory Visit: Payer: Medicare Other | Admitting: Occupational Therapy

## 2011-06-02 ENCOUNTER — Encounter: Payer: Medicare Other | Admitting: Occupational Therapy

## 2011-06-02 ENCOUNTER — Ambulatory Visit: Payer: Medicare Other | Admitting: Physical Therapy

## 2011-06-05 ENCOUNTER — Encounter: Payer: Medicare Other | Admitting: Occupational Therapy

## 2011-06-05 ENCOUNTER — Ambulatory Visit: Payer: Medicare Other | Admitting: Physical Therapy

## 2011-06-10 ENCOUNTER — Ambulatory Visit: Payer: Medicare Other | Admitting: Physical Therapy

## 2011-06-12 ENCOUNTER — Ambulatory Visit: Payer: Medicare Other | Admitting: Physical Therapy

## 2011-06-16 ENCOUNTER — Ambulatory Visit: Payer: Medicare Other | Attending: Physical Medicine & Rehabilitation | Admitting: *Deleted

## 2011-06-16 DIAGNOSIS — M2560 Stiffness of unspecified joint, not elsewhere classified: Secondary | ICD-10-CM | POA: Insufficient documentation

## 2011-06-16 DIAGNOSIS — R5381 Other malaise: Secondary | ICD-10-CM | POA: Insufficient documentation

## 2011-06-16 DIAGNOSIS — Z5189 Encounter for other specified aftercare: Secondary | ICD-10-CM | POA: Insufficient documentation

## 2011-06-16 DIAGNOSIS — R269 Unspecified abnormalities of gait and mobility: Secondary | ICD-10-CM | POA: Insufficient documentation

## 2011-06-16 DIAGNOSIS — M6281 Muscle weakness (generalized): Secondary | ICD-10-CM | POA: Insufficient documentation

## 2011-06-17 ENCOUNTER — Ambulatory Visit: Payer: Medicare Other | Admitting: Physical Therapy

## 2011-06-18 ENCOUNTER — Other Ambulatory Visit: Payer: Self-pay | Admitting: Family Medicine

## 2011-06-18 ENCOUNTER — Ambulatory Visit: Payer: Medicare Other | Admitting: *Deleted

## 2011-06-18 DIAGNOSIS — E041 Nontoxic single thyroid nodule: Secondary | ICD-10-CM

## 2011-06-19 ENCOUNTER — Ambulatory Visit: Payer: Medicare Other | Admitting: Physical Therapy

## 2011-06-20 ENCOUNTER — Encounter: Payer: Medicare Other | Admitting: Occupational Therapy

## 2011-06-23 ENCOUNTER — Encounter: Payer: Medicare Other | Admitting: Occupational Therapy

## 2011-06-24 ENCOUNTER — Other Ambulatory Visit (HOSPITAL_COMMUNITY)
Admission: RE | Admit: 2011-06-24 | Discharge: 2011-06-24 | Disposition: A | Discharge status: Home / Self Care | Payer: Medicare Other | Source: Ambulatory Visit | Attending: Interventional Radiology | Admitting: Interventional Radiology

## 2011-06-24 ENCOUNTER — Ambulatory Visit
Admission: RE | Admit: 2011-06-24 | Discharge: 2011-06-24 | Disposition: A | Discharge status: Home / Self Care | Payer: Medicare Other | Source: Ambulatory Visit | Attending: Family Medicine | Admitting: Family Medicine

## 2011-06-24 DIAGNOSIS — E041 Nontoxic single thyroid nodule: Secondary | ICD-10-CM

## 2011-06-24 DIAGNOSIS — E049 Nontoxic goiter, unspecified: Secondary | ICD-10-CM | POA: Insufficient documentation

## 2011-06-25 ENCOUNTER — Encounter: Payer: Medicare Other | Admitting: Cardiothoracic Surgery

## 2011-06-26 ENCOUNTER — Ambulatory Visit: Payer: Medicare Other | Admitting: *Deleted

## 2011-06-26 ENCOUNTER — Encounter: Payer: Medicare Other | Admitting: Occupational Therapy

## 2011-06-27 ENCOUNTER — Ambulatory Visit: Payer: Medicare Other | Admitting: Physical Therapy

## 2011-07-01 ENCOUNTER — Encounter: Payer: Medicare Other | Admitting: Occupational Therapy

## 2011-07-03 ENCOUNTER — Encounter: Payer: Medicare Other | Admitting: Occupational Therapy

## 2011-07-08 ENCOUNTER — Encounter: Payer: Medicare Other | Admitting: Occupational Therapy

## 2011-07-10 ENCOUNTER — Encounter: Payer: Medicare Other | Admitting: Occupational Therapy

## 2011-07-10 ENCOUNTER — Encounter: Payer: Self-pay | Admitting: Cardiothoracic Surgery

## 2011-07-10 DIAGNOSIS — F419 Anxiety disorder, unspecified: Secondary | ICD-10-CM | POA: Insufficient documentation

## 2011-07-10 DIAGNOSIS — J302 Other seasonal allergic rhinitis: Secondary | ICD-10-CM | POA: Insufficient documentation

## 2011-07-11 ENCOUNTER — Encounter (INDEPENDENT_AMBULATORY_CARE_PROVIDER_SITE_OTHER): Payer: Self-pay | Admitting: Surgery

## 2011-07-14 ENCOUNTER — Ambulatory Visit (INDEPENDENT_AMBULATORY_CARE_PROVIDER_SITE_OTHER): Payer: Medicare Other | Admitting: Surgery

## 2011-07-14 ENCOUNTER — Encounter (INDEPENDENT_AMBULATORY_CARE_PROVIDER_SITE_OTHER): Payer: Self-pay | Admitting: Surgery

## 2011-07-14 VITALS — BP 98/68 | HR 80 | Temp 97.4°F | Resp 12 | Ht 64.0 in | Wt 131.2 lb

## 2011-07-14 DIAGNOSIS — E041 Nontoxic single thyroid nodule: Secondary | ICD-10-CM | POA: Insufficient documentation
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
## 2011-07-15 ENCOUNTER — Encounter: Payer: Medicare Other | Admitting: Occupational Therapy

## 2011-07-15 DIAGNOSIS — I712 Thoracic aortic aneurysm, without rupture, unspecified: Secondary | ICD-10-CM | POA: Insufficient documentation

## 2011-07-16 ENCOUNTER — Institutional Professional Consult (permissible substitution) (INDEPENDENT_AMBULATORY_CARE_PROVIDER_SITE_OTHER): Payer: Medicare Other | Admitting: Cardiothoracic Surgery

## 2011-07-16 ENCOUNTER — Encounter: Payer: Self-pay | Admitting: Cardiothoracic Surgery

## 2011-07-16 VITALS — BP 105/67 | HR 75 | Resp 16

## 2011-07-16 DIAGNOSIS — I712 Thoracic aortic aneurysm, without rupture, unspecified: Secondary | ICD-10-CM
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
## 2011-07-16 NOTE — Patient Instructions (Signed)
Continue current medications. Return for chest CT of the thoracic aorta July 2013 to follow up the recently diagnosed fusiform a ascending aneurysm 4.5 cm diameter.

## 2011-07-17 ENCOUNTER — Encounter: Payer: Medicare Other | Admitting: Occupational Therapy

## 2011-07-22 ENCOUNTER — Encounter: Payer: Self-pay | Admitting: Occupational Therapy

## 2011-07-24 ENCOUNTER — Encounter: Payer: Medicare Other | Admitting: Occupational Therapy

## 2011-07-29 ENCOUNTER — Encounter: Payer: Medicare Other | Admitting: Occupational Therapy

## 2011-07-31 ENCOUNTER — Encounter: Payer: Medicare Other | Admitting: Occupational Therapy

## 2011-08-06 ENCOUNTER — Other Ambulatory Visit: Payer: Self-pay | Admitting: Urology

## 2011-08-06 DIAGNOSIS — N281 Cyst of kidney, acquired: Secondary | ICD-10-CM

## 2011-08-13 ENCOUNTER — Encounter (HOSPITAL_COMMUNITY): Payer: Self-pay | Admitting: Pharmacy Technician

## 2011-08-19 ENCOUNTER — Encounter (HOSPITAL_COMMUNITY): Payer: Self-pay

## 2011-08-19 ENCOUNTER — Encounter (HOSPITAL_COMMUNITY)
Admission: RE | Admit: 2011-08-19 | Discharge: 2011-08-19 | Disposition: A | Discharge status: Home / Self Care | Payer: Medicare Other | Source: Ambulatory Visit | Attending: Surgery | Admitting: Surgery

## 2011-08-19 DIAGNOSIS — K219 Gastro-esophageal reflux disease without esophagitis: Secondary | ICD-10-CM

## 2011-08-19 DIAGNOSIS — IMO0001 Reserved for inherently not codable concepts without codable children: Secondary | ICD-10-CM

## 2011-08-19 DIAGNOSIS — J209 Acute bronchitis, unspecified: Secondary | ICD-10-CM

## 2011-08-19 DIAGNOSIS — E039 Hypothyroidism, unspecified: Secondary | ICD-10-CM

## 2011-08-19 DIAGNOSIS — Z9109 Other allergy status, other than to drugs and biological substances: Secondary | ICD-10-CM

## 2011-08-19 HISTORY — DX: Other allergy status, other than to drugs and biological substances: Z91.09

## 2011-08-19 HISTORY — DX: Reserved for inherently not codable concepts without codable children: IMO0001

## 2011-08-19 HISTORY — DX: Acute bronchitis, unspecified: J20.9

## 2011-08-19 HISTORY — DX: Hypothyroidism, unspecified: E03.9

## 2011-08-19 HISTORY — DX: Gastro-esophageal reflux disease without esophagitis: K21.9

## 2011-08-19 HISTORY — DX: Shortness of breath: R06.02

## 2011-08-19 LAB — BASIC METABOLIC PANEL
BUN: 12 mg/dL (ref 6–23)
CO2: 28 mEq/L (ref 19–32)
Chloride: 103 mEq/L (ref 96–112)
Glucose, Bld: 77 mg/dL (ref 70–99)
Potassium: 3.8 mEq/L (ref 3.5–5.1)
Sodium: 140 mEq/L (ref 135–145)

## 2011-08-19 LAB — URINALYSIS, ROUTINE W REFLEX MICROSCOPIC
Glucose, UA: 250 mg/dL — AB
Hgb urine dipstick: NEGATIVE
Leukocytes, UA: NEGATIVE
Protein, ur: NEGATIVE mg/dL
Specific Gravity, Urine: 1.022 (ref 1.005–1.030)
Urobilinogen, UA: 1 mg/dL (ref 0.0–1.0)

## 2011-08-19 LAB — DIFFERENTIAL
Basophils Relative: 0 % (ref 0–1)
Eosinophils Absolute: 0.3 10*3/uL (ref 0.0–0.7)
Lymphs Abs: 2.6 10*3/uL (ref 0.7–4.0)
Monocytes Absolute: 0.6 10*3/uL (ref 0.1–1.0)
Monocytes Relative: 7 % (ref 3–12)

## 2011-08-19 LAB — SURGICAL PCR SCREEN: MRSA, PCR: NEGATIVE

## 2011-08-19 LAB — CBC
HCT: 41 % (ref 36.0–46.0)
Hemoglobin: 13.4 g/dL (ref 12.0–15.0)
MCH: 30.1 pg (ref 26.0–34.0)
MCHC: 32.7 g/dL (ref 30.0–36.0)
RBC: 4.45 MIL/uL (ref 3.87–5.11)

## 2011-08-19 LAB — PROTIME-INR: INR: 0.96 (ref 0.00–1.49)

## 2011-08-19 NOTE — Progress Notes (Signed)
Quick Note:  These results are acceptable for scheduled surgery. TMG ______ 

## 2011-08-19 NOTE — Pre-Procedure Instructions (Signed)
EKG/ CXR 7'12 in Epic

## 2011-08-19 NOTE — Patient Instructions (Signed)
20 TYLEA HISE  08/19/2011   Your procedure is scheduled on:  08-21-11  Report to [facility] at 0730 AM.  Call this number if you have problems the morning of surgery: [phone]   Remember:   Do not eat food:After Midnight.  May have clear liquids:until Midnight .  Clear liquids include soda, tea, black coffee, apple or grape juice, broth.  Take these medicines the morning of surgery with A SIP OF WATER:Lipitor, Lexapro    Do not wear jewelry, make-up or nail polish.  Do not wear lotions, powders, or perfumes. You may wear deodorant.  Do not shave 48 hours prior to surgery.  Do not bring valuables to the hospital.  Contacts, dentures or bridgework may not be worn into surgery.  Leave suitcase in the car. After surgery it may be brought to your room.  For patients admitted to the hospital, checkout time is 11:00 AM the day of discharge.   Patients discharged the day of surgery will not be allowed to drive home.  Name and phone number of your driver: son Kataryna Mcquilkin, [phone]  Special Instructions: CHG Shower Use Special Wash: 1/2 bottle night before surgery and 1/2 bottle morning of surgery.   Please read over the following fact sheets that you were given: MRSA Information, Incentive Spirometry instruction.

## 2011-08-19 NOTE — Progress Notes (Signed)
Faxed to [facility] 

## 2011-08-19 NOTE — Pre-Procedure Instructions (Signed)
08-19-11 EKG/ CXR 7'12 reports with chart. 

## 2011-08-21 ENCOUNTER — Ambulatory Visit (HOSPITAL_COMMUNITY): Payer: Medicare Other | Admitting: Anesthesiology

## 2011-08-21 ENCOUNTER — Other Ambulatory Visit (INDEPENDENT_AMBULATORY_CARE_PROVIDER_SITE_OTHER): Payer: Self-pay | Admitting: Surgery

## 2011-08-21 ENCOUNTER — Encounter (HOSPITAL_COMMUNITY): Payer: Self-pay | Admitting: Anesthesiology

## 2011-08-21 ENCOUNTER — Encounter (HOSPITAL_COMMUNITY): Payer: Self-pay | Admitting: *Deleted

## 2011-08-21 ENCOUNTER — Inpatient Hospital Stay (HOSPITAL_COMMUNITY)
Admission: RE | Admit: 2011-08-21 | Discharge: 2011-08-22 | DRG: 627 | Disposition: A | Discharge status: Home / Self Care | Payer: Medicare Other | Source: Ambulatory Visit | Attending: Surgery | Admitting: Surgery

## 2011-08-21 ENCOUNTER — Encounter (HOSPITAL_COMMUNITY)
Admission: RE | Disposition: A | Discharge status: Home / Self Care | Payer: Self-pay | Source: Ambulatory Visit | Attending: Surgery

## 2011-08-21 DIAGNOSIS — J309 Allergic rhinitis, unspecified: Secondary | ICD-10-CM | POA: Diagnosis present

## 2011-08-21 DIAGNOSIS — E785 Hyperlipidemia, unspecified: Secondary | ICD-10-CM | POA: Diagnosis present

## 2011-08-21 DIAGNOSIS — F3289 Other specified depressive episodes: Secondary | ICD-10-CM | POA: Diagnosis present

## 2011-08-21 DIAGNOSIS — F411 Generalized anxiety disorder: Secondary | ICD-10-CM | POA: Diagnosis present

## 2011-08-21 DIAGNOSIS — E039 Hypothyroidism, unspecified: Secondary | ICD-10-CM | POA: Diagnosis present

## 2011-08-21 DIAGNOSIS — E041 Nontoxic single thyroid nodule: Principal | ICD-10-CM | POA: Diagnosis present

## 2011-08-21 DIAGNOSIS — K219 Gastro-esophageal reflux disease without esophagitis: Secondary | ICD-10-CM | POA: Diagnosis present

## 2011-08-21 DIAGNOSIS — D34 Benign neoplasm of thyroid gland: Secondary | ICD-10-CM

## 2011-08-21 DIAGNOSIS — F329 Major depressive disorder, single episode, unspecified: Secondary | ICD-10-CM | POA: Diagnosis present

## 2011-08-21 DIAGNOSIS — H409 Unspecified glaucoma: Secondary | ICD-10-CM | POA: Diagnosis present

## 2011-08-21 DIAGNOSIS — Z87820 Personal history of traumatic brain injury: Secondary | ICD-10-CM

## 2011-08-21 DIAGNOSIS — Z01812 Encounter for preprocedural laboratory examination: Secondary | ICD-10-CM

## 2011-08-21 HISTORY — PX: THYROID LOBECTOMY: SHX420

## 2011-08-21 SURGERY — LOBECTOMY, THYROID
Laterality: Left | Wound class: Clean

## 2011-08-21 MED ORDER — HYDROCODONE-ACETAMINOPHEN 5-325 MG PO TABS
1.0000 | ORAL_TABLET | ORAL | Status: DC | PRN
  Filled 2011-08-21: qty 2
  Filled 2011-08-21: qty 1

## 2011-08-21 MED ORDER — PROMETHAZINE HCL 25 MG/ML IJ SOLN: 6.2500 mg | INTRAMUSCULAR | Status: DC | PRN

## 2011-08-21 MED ORDER — HYDROCODONE-ACETAMINOPHEN 5-325 MG PO TABS
1.0000 | ORAL_TABLET | ORAL | Status: DC | PRN
  Administered 2011-08-21 – 2011-08-22 (×3): 1 via ORAL
  Filled 2011-08-21: qty 1

## 2011-08-21 MED ORDER — FENTANYL CITRATE 0.05 MG/ML IJ SOLN
25.0000 ug | INTRAMUSCULAR | Status: DC | PRN
  Administered 2011-08-21 (×3): 50 ug via INTRAVENOUS

## 2011-08-21 MED ORDER — LACTATED RINGERS IV SOLN
INTRAVENOUS | Status: DC
  Administered 2011-08-21: 1000 mL via INTRAVENOUS

## 2011-08-21 MED ORDER — LACTATED RINGERS IV SOLN
INTRAVENOUS | Status: DC
  Administered 2011-08-21 – 2011-08-22 (×2): via INTRAVENOUS

## 2011-08-21 MED ORDER — ACETAMINOPHEN 10 MG/ML IV SOLN
INTRAVENOUS | Status: DC | PRN
  Administered 2011-08-21: 1000 mg via INTRAVENOUS

## 2011-08-21 MED ORDER — ESCITALOPRAM OXALATE 10 MG PO TABS
10.0000 mg | ORAL_TABLET | Freq: Every day | ORAL | Status: DC
  Administered 2011-08-22: 10 mg via ORAL
  Filled 2011-08-21: qty 1

## 2011-08-21 MED ORDER — LACTATED RINGERS IV SOLN: INTRAVENOUS | Status: DC

## 2011-08-21 MED ORDER — PROPOFOL 10 MG/ML IV BOLUS
INTRAVENOUS | Status: DC | PRN
  Administered 2011-08-21: 100 mg via INTRAVENOUS

## 2011-08-21 MED ORDER — FENTANYL CITRATE 0.05 MG/ML IJ SOLN
INTRAMUSCULAR | Status: AC
  Filled 2011-08-21: qty 2

## 2011-08-21 MED ORDER — NEOSTIGMINE METHYLSULFATE 1 MG/ML IJ SOLN
INTRAMUSCULAR | Status: DC | PRN
  Administered 2011-08-21: 3.5 mg via INTRAVENOUS

## 2011-08-21 MED ORDER — ROCURONIUM BROMIDE 100 MG/10ML IV SOLN
INTRAVENOUS | Status: DC | PRN
  Administered 2011-08-21: 30 mg via INTRAVENOUS
  Administered 2011-08-21: 10 mg via INTRAVENOUS

## 2011-08-21 MED ORDER — SODIUM CHLORIDE 0.9 % IR SOLN
Status: DC | PRN
  Administered 2011-08-21: 1000 mL

## 2011-08-21 MED ORDER — FENTANYL CITRATE 0.05 MG/ML IJ SOLN
INTRAMUSCULAR | Status: DC | PRN
  Administered 2011-08-21: 100 ug via INTRAVENOUS
  Administered 2011-08-21 (×2): 50 ug via INTRAVENOUS

## 2011-08-21 MED ORDER — ONDANSETRON HCL 4 MG/2ML IJ SOLN
INTRAMUSCULAR | Status: DC | PRN
  Administered 2011-08-21: 4 mg via INTRAVENOUS

## 2011-08-21 MED ORDER — LATANOPROST 0.005 % OP SOLN
1.0000 [drp] | Freq: Every day | OPHTHALMIC | Status: DC
  Administered 2011-08-21: 1 [drp] via OPHTHALMIC
  Filled 2011-08-21: qty 2.5

## 2011-08-21 MED ORDER — MIDAZOLAM HCL 5 MG/5ML IJ SOLN
INTRAMUSCULAR | Status: DC | PRN
  Administered 2011-08-21: 2 mg via INTRAVENOUS

## 2011-08-21 MED ORDER — GLYCOPYRROLATE 0.2 MG/ML IJ SOLN
INTRAMUSCULAR | Status: DC | PRN
  Administered 2011-08-21: .5 mg via INTRAVENOUS

## 2011-08-21 MED ORDER — HYDROMORPHONE HCL PF 1 MG/ML IJ SOLN
1.0000 mg | INTRAMUSCULAR | Status: DC | PRN
  Administered 2011-08-21 (×2): 1 mg via INTRAVENOUS
  Filled 2011-08-21 (×2): qty 1

## 2011-08-21 MED ORDER — CEFAZOLIN SODIUM 1-5 GM-% IV SOLN
1.0000 g | Freq: Once | INTRAVENOUS | Status: AC
  Administered 2011-08-21: 1 g via INTRAVENOUS

## 2011-08-21 MED ORDER — LIDOCAINE HCL (CARDIAC) 20 MG/ML IV SOLN
INTRAVENOUS | Status: DC | PRN
  Administered 2011-08-21: 75 mg via INTRAVENOUS

## 2011-08-21 MED ORDER — EPHEDRINE SULFATE 50 MG/ML IJ SOLN
INTRAMUSCULAR | Status: DC | PRN
  Administered 2011-08-21: 5 mg via INTRAVENOUS

## 2011-08-21 MED ORDER — HYDROMORPHONE HCL PF 1 MG/ML IJ SOLN
0.2500 mg | INTRAMUSCULAR | Status: DC | PRN
  Administered 2011-08-21: 0.5 mg via INTRAVENOUS

## 2011-08-21 MED ORDER — HYDROMORPHONE HCL PF 1 MG/ML IJ SOLN
INTRAMUSCULAR | Status: AC
  Filled 2011-08-21: qty 1

## 2011-08-21 SURGICAL SUPPLY — 38 items
APL SKNCLS STERI-STRIP NONHPOA (GAUZE/BANDAGES/DRESSINGS) ×1
ATTRACTOMAT 16X20 MAGNETIC DRP (DRAPES) ×2 IMPLANT
BENZOIN TINCTURE PRP APPL 2/3 (GAUZE/BANDAGES/DRESSINGS) ×2 IMPLANT
BLADE HEX COATED 2.75 (ELECTRODE) ×2 IMPLANT
BLADE SURG 15 STRL LF DISP TIS (BLADE) ×1 IMPLANT
BLADE SURG 15 STRL SS (BLADE) ×2
CANISTER SUCTION 2500CC (MISCELLANEOUS) ×2 IMPLANT
CHLORAPREP W/TINT 10.5 ML (MISCELLANEOUS) ×2 IMPLANT
CLIP TI MEDIUM 6 (CLIP) ×4 IMPLANT
CLIP TI WIDE RED SMALL 6 (CLIP) ×4 IMPLANT
CLOTH BEACON ORANGE TIMEOUT ST (SAFETY) ×2 IMPLANT
DISSECTOR ROUND CHERRY 3/8 STR (MISCELLANEOUS) IMPLANT
DRAPE PED LAPAROTOMY (DRAPES) ×2 IMPLANT
DRESSING SURGICEL FIBRLLR 1X2 (HEMOSTASIS) ×1 IMPLANT
DRSG SURGICEL FIBRILLAR 1X2 (HEMOSTASIS) ×2
ELECT REM PT RETURN 9FT ADLT (ELECTROSURGICAL) ×2
ELECTRODE REM PT RTRN 9FT ADLT (ELECTROSURGICAL) ×1 IMPLANT
GAUZE SPONGE 4X4 16PLY XRAY LF (GAUZE/BANDAGES/DRESSINGS) ×2 IMPLANT
GLOVE SURG ORTHO 8.0 STRL STRW (GLOVE) ×2 IMPLANT
GOWN STRL NON-REIN LRG LVL3 (GOWN DISPOSABLE) ×2 IMPLANT
GOWN STRL REIN XL XLG (GOWN DISPOSABLE) ×4 IMPLANT
KIT BASIN OR (CUSTOM PROCEDURE TRAY) ×2 IMPLANT
NS IRRIG 1000ML POUR BTL (IV SOLUTION) ×2 IMPLANT
PACK BASIC VI WITH GOWN DISP (CUSTOM PROCEDURE TRAY) ×2 IMPLANT
PENCIL BUTTON HOLSTER BLD 10FT (ELECTRODE) ×2 IMPLANT
SHEARS HARMONIC 9CM CVD (BLADE) ×2 IMPLANT
SPONGE GAUZE 4X4 12PLY (GAUZE/BANDAGES/DRESSINGS) ×1 IMPLANT
STAPLER VISISTAT 35W (STAPLE) ×2 IMPLANT
STRIP CLOSURE SKIN 1/2X4 (GAUZE/BANDAGES/DRESSINGS) ×2 IMPLANT
SUT MNCRL AB 4-0 PS2 18 (SUTURE) ×2 IMPLANT
SUT SILK 2 0 (SUTURE) ×2
SUT SILK 2-0 18XBRD TIE 12 (SUTURE) ×1 IMPLANT
SUT SILK 3 0 (SUTURE)
SUT SILK 3-0 18XBRD TIE 12 (SUTURE) IMPLANT
SUT VIC AB 3-0 SH 18 (SUTURE) ×2 IMPLANT
SYR BULB IRRIGATION 50ML (SYRINGE) ×2 IMPLANT
TOWEL OR 17X26 10 PK STRL BLUE (TOWEL DISPOSABLE) ×2 IMPLANT
YANKAUER SUCT BULB TIP 10FT TU (MISCELLANEOUS) ×2 IMPLANT
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
## 2011-08-21 NOTE — Progress Notes (Signed)
Dr. Rica Mast made aware of patient's heart rates

## 2011-08-21 NOTE — Transfer of Care (Signed)
Immediate Anesthesia Transfer of Care Note  Patient: [name]  Procedure(s) Performed:  THYROID LOBECTOMY - left thyroid lobectomy  Patient Location: PACU  Anesthesia Type: General  Level of Consciousness: awake, alert , oriented and patient cooperative  Airway & Oxygen Therapy: Patient Spontanous Breathing and Patient connected to face mask oxygen  Post-op Assessment: Report given to PACU RN and Post -op Vital signs reviewed and stable  Post vital signs: Reviewed and stable  Complications: No apparent anesthesia complications

## 2011-08-21 NOTE — Interval H&P Note (Signed)
History and Physical Interval Note:  08/21/2011 9:34 AM  [name]  has presented today for surgery, with the diagnosis of Left Thyroid Lobectomy. The various methods of treatment have been discussed with the patient and family. After consideration of risks, benefits and other options for treatment, the patient has consented to  Procedure(s):  LEFT THYROID LOBECTOMY as a surgical intervention .    The patients' history has been reviewed, patient examined, no change in status, stable for surgery.  I have reviewed the patients' chart and labs.  Questions were answered to the patient's satisfaction.     Tashari Schoenfelder Judie Petit
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
## 2011-08-21 NOTE — Anesthesia Postprocedure Evaluation (Signed)
Anesthesia Post Note  Patient: [name]  Procedure(s) Performed:  THYROID LOBECTOMY - left thyroid lobectomy  Anesthesia type: General  Patient location: PACU  Post pain: Pain level controlled  Post assessment: Post-op Vital signs reviewed  Last Vitals:  Filed Vitals:   08/21/11 1215  BP: 119/64  Pulse: 53  Temp:   Resp: 16    Post vital signs: Reviewed  Level of consciousness: sedated  Complications: No apparent anesthesia complications
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
## 2011-08-21 NOTE — Brief Op Note (Signed)
08/21/2011  10:55 AM  PATIENT:  Courtney Heys  65 y.o. female  PRE-OPERATIVE DIAGNOSIS:  Left Thyroid Nodule (Hurthle cell lesion)  POST-OPERATIVE DIAGNOSIS:  same  PROCEDURE:  Left thyroid lobectomy  SURGEON:  Velora Heckler, MD, FACS  ASSISTANTS: none   ANESTHESIA:   general  EBL:  Total I/O In: 150 [IV Piggyback:150] Out: -   BLOOD ADMINISTERED:none  DRAINS: none   LOCAL MEDICATIONS USED:  NONE  SPECIMEN:  Excision  DISPOSITION OF SPECIMEN:  PATHOLOGY  COUNTS:  YES  TOURNIQUET:  * No tourniquets in log *  DICTATION: .Other Dictation: Dictation Number [id number]  PLAN OF CARE: Admit for overnight observation  PATIENT DISPOSITION:  PACU - hemodynamically stable.

## 2011-08-22 ENCOUNTER — Encounter (HOSPITAL_COMMUNITY): Payer: Self-pay | Admitting: Surgery

## 2011-08-22 MED ORDER — HYDROCODONE-ACETAMINOPHEN 5-325 MG PO TABS: 1.0000 | ORAL_TABLET | ORAL | Status: AC | PRN

## 2011-08-22 NOTE — Discharge Summary (Signed)
Physician Discharge Summary  Patient ID: [name] MRN: [id number] DOB/AGE: 67/24/1947 67 y.o.  Admit date: 08/21/2011 Discharge date: 08/22/2011  Admission Diagnoses:  Discharge Diagnoses:  Active Problems:  * No active hospital problems. *    Discharged Condition: good  Hospital Course: Pt admitted after thyroid lobectomy.  Stable course.  Prepared for discharge on morning following surgery.  Consults: none  Significant Diagnostic Studies: none  Treatments: surgery: left thyroid lobectomy  Discharge Exam: Blood pressure 95/62, pulse 51, temperature 97.4 F (36.3 C), temperature source Oral, resp. rate 16, SpO2 99.00%. Wound clear and dry.  Minimal soft tissue swelling.  Voice normal.  Disposition: Home or Self Care  Discharge Orders    Future Appointments: Provider: Department: Dept Phone: Center:   09/10/2011 10:00 AM [facility] [phone] [place]   09/11/2011 11:00 AM Velora Heckler, MD [facility] [phone] None     Future Orders Please Complete By Expires   Diet - low sodium heart healthy      Increase activity slowly      Discharge instructions      Comments:   [facility], [place] [phone]  THYROID & PARATHYROID SURGERY -- POST OP INSTRUCTIONS  Always review your discharge instruction sheet from the facility where your surgery was performed.  A prescription for pain medication may be given to you upon discharge.  Take your pain medication as prescribed, if needed.  If narcotic pain medicine is not needed, then you may take acetaminophen (Tylenol) or ibuprofen (Advil) as needed. Take your usually prescribed medications unless otherwise directed. If you need a refill on your pain medication, please contact your pharmacy. They will contact our office to request authorization.  Prescriptions will not be processed after 5 pm or on weekends. Start with a light diet upon arrival home, such as soup and crackers, etc.  Be sure to drink  pleny of fluids daily.  Resume your normal diet the day after surgery. Most patients will experience some swelling and bruising on the chest and neck area.  Ice packs will help.  Swelling and bruising can take several days to resolve.  It is common to experience some constipation if taking pain medication after surgery.  Increasing fluid intake and taking a stool softener will usually help or prevent this problem.  A mild laxative (Milk of Magnesia or Miralax) should be taken according to package directions if there are no bowel movements after 48 hours. You may remove your bandages 24-48 hours after surgery, and you may shower at that time.  You have steri-strips (small skin tapes) in place directly over the incision.  These strips should be left on the skin for 7-10 days and then removed. You may resume regular (light) daily activities beginning the next day-such as daily self-care, walking, climbing stairs-gradually increasing activities as tolerated.  You may have sexual intercourse when it is comfortable.  Refrain from any heavy lifting or straining until approved by your doctor.  You may drive when you no longer are taking prescription pain medication, you can comfortably wear a seatbelt, and you can safely maneuver your car and apply brakes. You should see your doctor in the office for a follow-up appointment approximately two weeks after your surgery.  Make sure that you call for this appointment within a day or two after you arrive home to insure a convenient appointment time.  WHEN TO CALL YOUR DOCTOR: Fever over 101.5 Inability to urinate Nausea and/or vomiting - persistent Extreme swelling  or bruising Continued bleeding from incision Increased pain, redness, or drainage from the incision Difficulty swallowing or breathing Muscle cramping or spasms Numbness or tingling in hands or feet or around lips  The clinic staff is available to answer your questions during regular business hours.   Please don't hesitate to call and ask to speak to one of the nurses if you have concerns.  [email or web address]   Remove dressing in 24 hours        Current Discharge Medication List    START taking these medications   Details  HYDROcodone-acetaminophen (NORCO) 5-325 MG per tablet Take 1-2 tablets by mouth every 4 (four) hours as needed. Qty: 30 tablet, Refills: 0      CONTINUE these medications which have NOT CHANGED   Details  atorvastatin (LIPITOR) 20 MG tablet Take 20 mg by mouth daily.     escitalopram (LEXAPRO) 10 MG tablet Take 10 mg by mouth every morning.     HYDROcodone-acetaminophen (VICODIN) 5-500 MG per tablet Take 1-2 tablets by mouth every 4 (four) hours as needed. For pain    latanoprost (XALATAN) 0.005 % ophthalmic solution Place 1 drop into both eyes at bedtime.     Multiple Vitamins-Minerals (MULTIVITAMINS THER. W/MINERALS) TABS Take 1 tablet by mouth daily.     Omega-3 Fatty Acids (FISH OIL) 500 MG CAPS Take 500 mg by mouth daily.     Polyethylene Glycol 3350 (MIRALAX PO) Take 17 g by mouth daily as needed. For constipation    loratadine (CLARITIN) 10 MG tablet Take 10 mg by mouth daily as needed. For allergies        Velora Heckler, MD, FACS General & Endocrine Surgery [facility]    Signed: Velora Heckler 08/22/2011, 9:28 AM

## 2011-08-22 NOTE — Progress Notes (Signed)
Patient's husband aware °

## 2011-08-22 NOTE — Progress Notes (Signed)
Quick Note:  Please contact patient with benign path results. TM[facility] ______ 

## 2011-08-22 NOTE — Progress Notes (Signed)
Patient discharged with husband and son. Family states understanding of discharge instructions.  Patient transported via wheelchair to car by [facility].   Clent Ridges

## 2011-09-10 ENCOUNTER — Other Ambulatory Visit: Payer: Self-pay | Admitting: Urology

## 2011-09-10 ENCOUNTER — Encounter (INDEPENDENT_AMBULATORY_CARE_PROVIDER_SITE_OTHER): Payer: Self-pay

## 2011-09-10 ENCOUNTER — Ambulatory Visit (HOSPITAL_COMMUNITY)
Admission: RE | Admit: 2011-09-10 | Discharge: 2011-09-10 | Disposition: A | Discharge status: Home / Self Care | Payer: Medicare Other | Source: Ambulatory Visit | Attending: Urology | Admitting: Urology

## 2011-09-10 DIAGNOSIS — N281 Cyst of kidney, acquired: Secondary | ICD-10-CM

## 2011-09-10 DIAGNOSIS — K8020 Calculus of gallbladder without cholecystitis without obstruction: Secondary | ICD-10-CM | POA: Insufficient documentation

## 2011-09-10 DIAGNOSIS — K7689 Other specified diseases of liver: Secondary | ICD-10-CM | POA: Insufficient documentation

## 2011-09-10 DIAGNOSIS — K838 Other specified diseases of biliary tract: Secondary | ICD-10-CM | POA: Insufficient documentation

## 2011-09-10 DIAGNOSIS — Q619 Cystic kidney disease, unspecified: Secondary | ICD-10-CM | POA: Insufficient documentation

## 2011-09-10 DIAGNOSIS — R109 Unspecified abdominal pain: Secondary | ICD-10-CM | POA: Insufficient documentation

## 2011-09-10 LAB — CREATININE, SERUM
Creatinine, Ser: 0.9 mg/dL (ref 0.50–1.10)
GFR calc Af Amer: 75 mL/min — ABNORMAL LOW (ref >90)

## 2011-09-10 MED ORDER — GADOBENATE DIMEGLUMINE 529 MG/ML IV SOLN
13.0000 mL | Freq: Once | INTRAVENOUS | Status: AC | PRN
  Administered 2011-09-10: 13 mL via INTRAVENOUS

## 2011-09-11 ENCOUNTER — Ambulatory Visit (INDEPENDENT_AMBULATORY_CARE_PROVIDER_SITE_OTHER): Payer: Medicare Other | Admitting: Surgery

## 2011-09-11 ENCOUNTER — Encounter (INDEPENDENT_AMBULATORY_CARE_PROVIDER_SITE_OTHER): Payer: Self-pay | Admitting: Surgery

## 2011-09-11 ENCOUNTER — Encounter (INDEPENDENT_AMBULATORY_CARE_PROVIDER_SITE_OTHER): Payer: Medicare Other | Admitting: Surgery

## 2011-09-11 VITALS — BP 120/84 | HR 60 | Temp 96.8°F | Resp 16 | Ht 64.0 in | Wt 130.8 lb

## 2011-09-11 DIAGNOSIS — E041 Nontoxic single thyroid nodule: Secondary | ICD-10-CM

## 2011-09-11 NOTE — Patient Instructions (Signed)
  COCOA BUTTER & VITAMIN E CREAM  ([employer] or other brand)  Apply cocoa butter/vitamin E cream to your incision 2 - 3 times daily.  Massage cream into incision for one minute with each application.  Use sunscreen (50 SPF or higher) for first 6 months after surgery.  You may substitute Mederma or other scar reducing creams as desired.   

## 2011-09-11 NOTE — Progress Notes (Signed)
Visit Diagnoses: 1. Thyroid nodule, uninodular, with Hurthle cell changes     HISTORY: Patient returns for her first postoperative visit having undergone thyroid lobectomy. Final pathology is benign.  EXAM: Surgical incision is well-healed. No sign of seroma. No sign of infection. Voice quality is normal.  IMPRESSION: Status post thyroid lobectomy for follicular adenoma  PLAN: Patient will begin applying topical creams to her incision. We will check her TSH level in 2 weeks. Patient will return to see me in 6 weeks for a final wound check.  Velora Heckler, MD, FACS General & Endocrine Surgery [facility]

## 2011-09-17 DIAGNOSIS — S63006A Unspecified dislocation of unspecified wrist and hand, initial encounter: Secondary | ICD-10-CM | POA: Diagnosis not present

## 2011-09-19 DIAGNOSIS — S63006A Unspecified dislocation of unspecified wrist and hand, initial encounter: Secondary | ICD-10-CM | POA: Diagnosis not present

## 2011-09-22 DIAGNOSIS — S37019A Minor contusion of unspecified kidney, initial encounter: Secondary | ICD-10-CM | POA: Diagnosis not present

## 2011-09-22 DIAGNOSIS — N281 Cyst of kidney, acquired: Secondary | ICD-10-CM | POA: Diagnosis not present

## 2011-09-24 ENCOUNTER — Other Ambulatory Visit (INDEPENDENT_AMBULATORY_CARE_PROVIDER_SITE_OTHER): Payer: Self-pay | Admitting: Surgery

## 2011-09-24 DIAGNOSIS — S63006A Unspecified dislocation of unspecified wrist and hand, initial encounter: Secondary | ICD-10-CM | POA: Diagnosis not present

## 2011-09-24 DIAGNOSIS — E041 Nontoxic single thyroid nodule: Secondary | ICD-10-CM | POA: Diagnosis not present

## 2011-09-25 ENCOUNTER — Telehealth (INDEPENDENT_AMBULATORY_CARE_PROVIDER_SITE_OTHER): Payer: Self-pay

## 2011-09-25 DIAGNOSIS — Z9889 Other specified postprocedural states: Secondary | ICD-10-CM

## 2011-09-25 DIAGNOSIS — E039 Hypothyroidism, unspecified: Secondary | ICD-10-CM

## 2011-09-25 LAB — TSH: TSH: 18.386 u[IU]/mL — ABNORMAL HIGH (ref 0.350–4.500)

## 2011-09-25 NOTE — Telephone Encounter (Signed)
   Synthroid 75 mcg, disp #30, refill 4. Called to [facility] [address]- Patient aware- she will have TSH level checked after 4 weeks of starting med.

## 2011-09-26 DIAGNOSIS — S63006A Unspecified dislocation of unspecified wrist and hand, initial encounter: Secondary | ICD-10-CM | POA: Diagnosis not present

## 2011-09-30 DIAGNOSIS — S63006A Unspecified dislocation of unspecified wrist and hand, initial encounter: Secondary | ICD-10-CM | POA: Diagnosis not present

## 2011-10-02 DIAGNOSIS — S63006A Unspecified dislocation of unspecified wrist and hand, initial encounter: Secondary | ICD-10-CM | POA: Diagnosis not present

## 2011-10-07 DIAGNOSIS — S63006A Unspecified dislocation of unspecified wrist and hand, initial encounter: Secondary | ICD-10-CM | POA: Diagnosis not present

## 2011-10-09 DIAGNOSIS — S63006A Unspecified dislocation of unspecified wrist and hand, initial encounter: Secondary | ICD-10-CM | POA: Diagnosis not present

## 2011-10-10 DIAGNOSIS — S63006A Unspecified dislocation of unspecified wrist and hand, initial encounter: Secondary | ICD-10-CM | POA: Diagnosis not present

## 2011-10-23 ENCOUNTER — Other Ambulatory Visit (INDEPENDENT_AMBULATORY_CARE_PROVIDER_SITE_OTHER): Payer: Self-pay | Admitting: Surgery

## 2011-10-23 DIAGNOSIS — E039 Hypothyroidism, unspecified: Secondary | ICD-10-CM | POA: Diagnosis not present

## 2011-10-23 DIAGNOSIS — E890 Postprocedural hypothyroidism: Secondary | ICD-10-CM | POA: Diagnosis not present

## 2011-10-23 DIAGNOSIS — E049 Nontoxic goiter, unspecified: Secondary | ICD-10-CM | POA: Diagnosis not present

## 2011-10-27 NOTE — Progress Notes (Signed)
Patient aware. Lab faxed to Dr. Wynelle Link

## 2011-12-03 ENCOUNTER — Encounter (INDEPENDENT_AMBULATORY_CARE_PROVIDER_SITE_OTHER): Payer: Self-pay | Admitting: Surgery

## 2011-12-03 ENCOUNTER — Ambulatory Visit (INDEPENDENT_AMBULATORY_CARE_PROVIDER_SITE_OTHER): Payer: Medicare Other | Admitting: Surgery

## 2011-12-03 VITALS — BP 113/69 | HR 68 | Temp 98.2°F | Ht 64.0 in | Wt 136.0 lb

## 2011-12-03 DIAGNOSIS — E041 Nontoxic single thyroid nodule: Secondary | ICD-10-CM

## 2011-12-03 MED ORDER — LEVOTHYROXINE SODIUM 75 MCG PO TABS: 75.0000 ug | ORAL_TABLET | Freq: Every day | ORAL | Status: DC

## 2011-12-03 NOTE — Progress Notes (Signed)
Visit Diagnoses: 1. Thyroid nodule, uninodular, with Hurthle cell changes     HISTORY: Patient is a 66 year old white female who underwent thyroid lobectomy for Hurthle cell adenoma. Final pathology was benign. She is now taking Synthroid 75 mcg daily. Recent TSH level was normal at 1.725.  She returns today for final wound check.  PERTINENT REVIEW OF SYSTEMS: The patient notes approximately 6 pound weight gain since surgery. She is sleeping normal. She denies palpitations. She denies tremor.  EXAM: HEENT: normocephalic; pupils equal and reactive; sclerae clear; dentition good; mucous membranes moist NECK:  symmetric on extension; no palpable anterior or posterior cervical lymphadenopathy; no supraclavicular masses; no tenderness; Surgical wound is well healed. It is slightly hyperemic. There are no palpable masses. There is no tenderness. CHEST: clear to auscultation bilaterally without rales, rhonchi, or wheezes CARDIAC: regular rate and rhythm without significant murmur; peripheral pulses are full EXT:  non-tender without edema; no deformity NEURO: no gross focal deficits; no sign of tremor   IMPRESSION: Status post thyroid lobectomy for Hurthle cell adenoma  PLAN: The patient will continue to take Synthroid 75 mcg daily. I have renewed her prescription for 6 months. I've asked her to see her primary physician in the fall for a TSH determination and dosage adjustment if necessary.  Patient will return to see me as needed.  Velora Heckler, MD, FACS General & Endocrine Surgery [facility]

## 2011-12-03 NOTE — Patient Instructions (Signed)
Please see your primary MD to have your thyroid levels checked this fall.  tmg

## 2011-12-09 DIAGNOSIS — E785 Hyperlipidemia, unspecified: Secondary | ICD-10-CM | POA: Diagnosis not present

## 2011-12-09 DIAGNOSIS — R350 Frequency of micturition: Secondary | ICD-10-CM | POA: Diagnosis not present

## 2011-12-09 DIAGNOSIS — Z1331 Encounter for screening for depression: Secondary | ICD-10-CM | POA: Diagnosis not present

## 2011-12-09 DIAGNOSIS — F411 Generalized anxiety disorder: Secondary | ICD-10-CM | POA: Diagnosis not present

## 2012-01-02 DIAGNOSIS — S63006A Unspecified dislocation of unspecified wrist and hand, initial encounter: Secondary | ICD-10-CM | POA: Diagnosis not present

## 2012-02-17 ENCOUNTER — Other Ambulatory Visit (INDEPENDENT_AMBULATORY_CARE_PROVIDER_SITE_OTHER): Payer: Self-pay | Admitting: Surgery

## 2012-02-17 ENCOUNTER — Telehealth (INDEPENDENT_AMBULATORY_CARE_PROVIDER_SITE_OTHER): Payer: Self-pay

## 2012-02-17 NOTE — Telephone Encounter (Signed)
Received request for synthroid refill. Per dictated note pt was given synthroid rx for 6 mon refill in march 2013. [facility] he verified  there is a standing order still in place for refills thru sept 2013. He will make notation in system re: this. Request in epic cleared and noted.

## 2012-02-25 ENCOUNTER — Other Ambulatory Visit: Payer: Self-pay | Admitting: Urology

## 2012-02-25 DIAGNOSIS — N281 Cyst of kidney, acquired: Secondary | ICD-10-CM

## 2012-03-04 ENCOUNTER — Other Ambulatory Visit: Payer: Self-pay | Admitting: Cardiothoracic Surgery

## 2012-03-04 DIAGNOSIS — I712 Thoracic aortic aneurysm, without rupture, unspecified: Secondary | ICD-10-CM

## 2012-03-17 ENCOUNTER — Ambulatory Visit (HOSPITAL_COMMUNITY)
Admission: RE | Admit: 2012-03-17 | Discharge: 2012-03-17 | Disposition: A | Discharge status: Home / Self Care | Payer: Medicare Other | Source: Ambulatory Visit | Attending: Urology | Admitting: Urology

## 2012-03-17 DIAGNOSIS — N281 Cyst of kidney, acquired: Secondary | ICD-10-CM

## 2012-03-17 DIAGNOSIS — Q619 Cystic kidney disease, unspecified: Secondary | ICD-10-CM | POA: Insufficient documentation

## 2012-03-17 DIAGNOSIS — K8020 Calculus of gallbladder without cholecystitis without obstruction: Secondary | ICD-10-CM | POA: Insufficient documentation

## 2012-03-17 DIAGNOSIS — K7689 Other specified diseases of liver: Secondary | ICD-10-CM | POA: Insufficient documentation

## 2012-03-17 DIAGNOSIS — N2889 Other specified disorders of kidney and ureter: Secondary | ICD-10-CM | POA: Diagnosis not present

## 2012-03-17 LAB — CREATININE, SERUM
Creatinine, Ser: 0.94 mg/dL (ref 0.50–1.10)
GFR calc Af Amer: 71 mL/min — ABNORMAL LOW (ref >90)

## 2012-03-17 MED ORDER — GADOBENATE DIMEGLUMINE 529 MG/ML IV SOLN
12.0000 mL | Freq: Once | INTRAVENOUS | Status: AC | PRN
  Administered 2012-03-17: 12 mL via INTRAVENOUS

## 2012-03-19 DIAGNOSIS — Q150 Congenital glaucoma: Secondary | ICD-10-CM | POA: Diagnosis not present

## 2012-03-19 DIAGNOSIS — H25019 Cortical age-related cataract, unspecified eye: Secondary | ICD-10-CM | POA: Diagnosis not present

## 2012-03-24 DIAGNOSIS — S37019A Minor contusion of unspecified kidney, initial encounter: Secondary | ICD-10-CM | POA: Diagnosis not present

## 2012-03-24 DIAGNOSIS — N281 Cyst of kidney, acquired: Secondary | ICD-10-CM | POA: Diagnosis not present

## 2012-03-29 ENCOUNTER — Ambulatory Visit
Admission: RE | Admit: 2012-03-29 | Discharge: 2012-03-29 | Disposition: A | Discharge status: Home / Self Care | Payer: Medicare Other | Source: Ambulatory Visit | Attending: Cardiothoracic Surgery | Admitting: Cardiothoracic Surgery

## 2012-03-29 DIAGNOSIS — I712 Thoracic aortic aneurysm, without rupture: Secondary | ICD-10-CM | POA: Diagnosis not present

## 2012-03-29 DIAGNOSIS — N289 Disorder of kidney and ureter, unspecified: Secondary | ICD-10-CM | POA: Diagnosis not present

## 2012-03-29 MED ORDER — IOHEXOL 300 MG/ML  SOLN
100.0000 mL | Freq: Once | INTRAMUSCULAR | Status: AC | PRN
  Administered 2012-03-29: 100 mL via INTRAVENOUS

## 2012-03-31 ENCOUNTER — Encounter: Payer: Self-pay | Admitting: Cardiothoracic Surgery

## 2012-03-31 ENCOUNTER — Ambulatory Visit (INDEPENDENT_AMBULATORY_CARE_PROVIDER_SITE_OTHER): Payer: Medicare Other | Admitting: Cardiothoracic Surgery

## 2012-03-31 VITALS — BP 97/52 | HR 87 | Resp 16 | Ht 64.0 in | Wt 141.5 lb

## 2012-03-31 DIAGNOSIS — I712 Thoracic aortic aneurysm, without rupture: Secondary | ICD-10-CM | POA: Diagnosis not present
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
## 2012-06-01 DIAGNOSIS — Z23 Encounter for immunization: Secondary | ICD-10-CM | POA: Diagnosis not present

## 2012-06-11 DIAGNOSIS — E039 Hypothyroidism, unspecified: Secondary | ICD-10-CM | POA: Diagnosis not present

## 2012-06-11 DIAGNOSIS — E785 Hyperlipidemia, unspecified: Secondary | ICD-10-CM | POA: Diagnosis not present

## 2012-06-11 DIAGNOSIS — F411 Generalized anxiety disorder: Secondary | ICD-10-CM | POA: Diagnosis not present

## 2012-08-16 DIAGNOSIS — R300 Dysuria: Secondary | ICD-10-CM | POA: Diagnosis not present

## 2012-08-16 DIAGNOSIS — N390 Urinary tract infection, site not specified: Secondary | ICD-10-CM | POA: Diagnosis not present

## 2012-08-16 DIAGNOSIS — R81 Glycosuria: Secondary | ICD-10-CM | POA: Diagnosis not present

## 2012-09-21 DIAGNOSIS — H401290 Low-tension glaucoma, unspecified eye, stage unspecified: Secondary | ICD-10-CM | POA: Diagnosis not present

## 2012-09-21 DIAGNOSIS — H409 Unspecified glaucoma: Secondary | ICD-10-CM | POA: Diagnosis not present

## 2012-12-03 DIAGNOSIS — H401290 Low-tension glaucoma, unspecified eye, stage unspecified: Secondary | ICD-10-CM | POA: Diagnosis not present

## 2012-12-03 DIAGNOSIS — H409 Unspecified glaucoma: Secondary | ICD-10-CM | POA: Diagnosis not present
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
## 2012-12-10 DIAGNOSIS — Z1331 Encounter for screening for depression: Secondary | ICD-10-CM | POA: Diagnosis not present

## 2012-12-10 DIAGNOSIS — F411 Generalized anxiety disorder: Secondary | ICD-10-CM | POA: Diagnosis not present

## 2012-12-10 DIAGNOSIS — E039 Hypothyroidism, unspecified: Secondary | ICD-10-CM | POA: Diagnosis not present

## 2012-12-10 DIAGNOSIS — E785 Hyperlipidemia, unspecified: Secondary | ICD-10-CM | POA: Diagnosis not present

## 2013-02-25 ENCOUNTER — Other Ambulatory Visit: Payer: Self-pay | Admitting: *Deleted

## 2013-02-25 DIAGNOSIS — I712 Thoracic aortic aneurysm, without rupture: Secondary | ICD-10-CM

## 2013-03-01 ENCOUNTER — Encounter: Payer: Self-pay | Admitting: Internal Medicine

## 2013-03-15 ENCOUNTER — Telehealth: Payer: Self-pay | Admitting: Internal Medicine

## 2013-03-15 NOTE — Telephone Encounter (Signed)
Per patient she would like to transfer to [facility]. She was transferred to medical records.

## 2013-03-17 ENCOUNTER — Other Ambulatory Visit: Payer: Self-pay | Admitting: Family Medicine

## 2013-03-17 DIAGNOSIS — Z1231 Encounter for screening mammogram for malignant neoplasm of breast: Secondary | ICD-10-CM

## 2013-03-28 DIAGNOSIS — S37019A Minor contusion of unspecified kidney, initial encounter: Secondary | ICD-10-CM | POA: Diagnosis not present

## 2013-03-28 DIAGNOSIS — N281 Cyst of kidney, acquired: Secondary | ICD-10-CM | POA: Diagnosis not present

## 2013-04-06 ENCOUNTER — Ambulatory Visit (INDEPENDENT_AMBULATORY_CARE_PROVIDER_SITE_OTHER): Payer: Medicare Other | Admitting: Cardiothoracic Surgery

## 2013-04-06 ENCOUNTER — Encounter: Payer: Self-pay | Admitting: Cardiothoracic Surgery

## 2013-04-06 ENCOUNTER — Ambulatory Visit
Admission: RE | Admit: 2013-04-06 | Discharge: 2013-04-06 | Disposition: A | Discharge status: Home / Self Care | Payer: Medicare Other | Source: Ambulatory Visit | Attending: Cardiothoracic Surgery | Admitting: Cardiothoracic Surgery

## 2013-04-06 VITALS — BP 108/70 | HR 77 | Resp 20 | Ht 64.0 in | Wt 150.0 lb

## 2013-04-06 DIAGNOSIS — I712 Thoracic aortic aneurysm, without rupture, unspecified: Secondary | ICD-10-CM

## 2013-04-06 MED ORDER — GADOBENATE DIMEGLUMINE 529 MG/ML IV SOLN
13.0000 mL | Freq: Once | INTRAVENOUS | Status: AC | PRN
  Administered 2013-04-06: 13 mL via INTRAVENOUS
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
## 2013-04-12 ENCOUNTER — Ambulatory Visit
Admission: RE | Admit: 2013-04-12 | Discharge: 2013-04-12 | Disposition: A | Discharge status: Home / Self Care | Payer: Medicare Other | Source: Ambulatory Visit | Attending: Family Medicine | Admitting: Family Medicine

## 2013-04-12 DIAGNOSIS — Z1231 Encounter for screening mammogram for malignant neoplasm of breast: Secondary | ICD-10-CM | POA: Diagnosis not present

## 2013-04-13 ENCOUNTER — Other Ambulatory Visit: Payer: Self-pay | Admitting: Family Medicine

## 2013-04-13 DIAGNOSIS — R928 Other abnormal and inconclusive findings on diagnostic imaging of breast: Secondary | ICD-10-CM

## 2013-04-14 DIAGNOSIS — Z8371 Family history of colonic polyps: Secondary | ICD-10-CM | POA: Diagnosis not present

## 2013-05-10 ENCOUNTER — Ambulatory Visit
Admission: RE | Admit: 2013-05-10 | Discharge: 2013-05-10 | Disposition: A | Discharge status: Home / Self Care | Payer: Medicare Other | Source: Ambulatory Visit | Attending: Family Medicine | Admitting: Family Medicine

## 2013-05-10 DIAGNOSIS — R928 Other abnormal and inconclusive findings on diagnostic imaging of breast: Secondary | ICD-10-CM

## 2013-05-10 DIAGNOSIS — N6019 Diffuse cystic mastopathy of unspecified breast: Secondary | ICD-10-CM | POA: Diagnosis not present

## 2013-06-14 DIAGNOSIS — E039 Hypothyroidism, unspecified: Secondary | ICD-10-CM | POA: Diagnosis not present

## 2013-06-14 DIAGNOSIS — E785 Hyperlipidemia, unspecified: Secondary | ICD-10-CM | POA: Diagnosis not present

## 2013-06-14 DIAGNOSIS — H9209 Otalgia, unspecified ear: Secondary | ICD-10-CM | POA: Diagnosis not present

## 2013-06-14 DIAGNOSIS — F411 Generalized anxiety disorder: Secondary | ICD-10-CM | POA: Diagnosis not present

## 2013-06-14 DIAGNOSIS — Z23 Encounter for immunization: Secondary | ICD-10-CM | POA: Diagnosis not present

## 2013-06-23 DIAGNOSIS — H401290 Low-tension glaucoma, unspecified eye, stage unspecified: Secondary | ICD-10-CM | POA: Diagnosis not present

## 2013-06-23 DIAGNOSIS — H409 Unspecified glaucoma: Secondary | ICD-10-CM | POA: Diagnosis not present

## 2013-07-19 ENCOUNTER — Other Ambulatory Visit: Payer: Self-pay | Admitting: Gastroenterology

## 2013-07-19 DIAGNOSIS — Z8371 Family history of colonic polyps: Secondary | ICD-10-CM | POA: Diagnosis not present

## 2013-07-19 DIAGNOSIS — D126 Benign neoplasm of colon, unspecified: Secondary | ICD-10-CM | POA: Diagnosis not present

## 2013-07-19 DIAGNOSIS — Z1211 Encounter for screening for malignant neoplasm of colon: Secondary | ICD-10-CM | POA: Diagnosis not present

## 2013-07-19 DIAGNOSIS — K5730 Diverticulosis of large intestine without perforation or abscess without bleeding: Secondary | ICD-10-CM | POA: Diagnosis not present

## 2013-09-19 ENCOUNTER — Other Ambulatory Visit: Payer: Self-pay

## 2013-09-19 DIAGNOSIS — I712 Thoracic aortic aneurysm, without rupture, unspecified: Secondary | ICD-10-CM

## 2013-10-05 ENCOUNTER — Ambulatory Visit: Payer: Medicare Other | Admitting: Surgery

## 2013-10-10 ENCOUNTER — Other Ambulatory Visit: Payer: Self-pay | Admitting: Cardiothoracic Surgery

## 2013-10-10 DIAGNOSIS — I712 Thoracic aortic aneurysm, without rupture, unspecified: Secondary | ICD-10-CM | POA: Diagnosis not present

## 2013-10-11 LAB — BUN: BUN: 18 mg/dL (ref 6–23)

## 2013-10-11 LAB — CREATININE, SERUM: Creat: 0.9 mg/dL (ref 0.50–1.10)

## 2013-10-12 ENCOUNTER — Encounter: Payer: Self-pay | Admitting: Cardiothoracic Surgery

## 2013-10-12 ENCOUNTER — Ambulatory Visit
Admission: RE | Admit: 2013-10-12 | Discharge: 2013-10-12 | Disposition: A | Discharge status: Home / Self Care | Payer: Medicare Other | Source: Ambulatory Visit | Attending: Cardiothoracic Surgery | Admitting: Cardiothoracic Surgery

## 2013-10-12 ENCOUNTER — Ambulatory Visit (INDEPENDENT_AMBULATORY_CARE_PROVIDER_SITE_OTHER): Payer: Medicare Other | Admitting: Cardiothoracic Surgery

## 2013-10-12 VITALS — BP 112/78 | HR 76 | Resp 16 | Ht 64.0 in | Wt 145.0 lb

## 2013-10-12 DIAGNOSIS — I712 Thoracic aortic aneurysm, without rupture, unspecified: Secondary | ICD-10-CM | POA: Diagnosis not present

## 2013-10-12 MED ORDER — GADOBENATE DIMEGLUMINE 529 MG/ML IV SOLN
14.0000 mL | Freq: Once | INTRAVENOUS | Status: AC | PRN
  Administered 2013-10-12: 14 mL via INTRAVENOUS
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
## 2013-12-01 DIAGNOSIS — H401290 Low-tension glaucoma, unspecified eye, stage unspecified: Secondary | ICD-10-CM | POA: Diagnosis not present

## 2013-12-01 DIAGNOSIS — H409 Unspecified glaucoma: Secondary | ICD-10-CM | POA: Diagnosis not present

## 2013-12-01 DIAGNOSIS — H47239 Glaucomatous optic atrophy, unspecified eye: Secondary | ICD-10-CM | POA: Diagnosis not present

## 2013-12-01 DIAGNOSIS — H5340 Unspecified visual field defects: Secondary | ICD-10-CM | POA: Diagnosis not present

## 2013-12-23 DIAGNOSIS — H409 Unspecified glaucoma: Secondary | ICD-10-CM | POA: Diagnosis not present

## 2013-12-23 DIAGNOSIS — H401290 Low-tension glaucoma, unspecified eye, stage unspecified: Secondary | ICD-10-CM | POA: Diagnosis not present

## 2013-12-23 DIAGNOSIS — H47239 Glaucomatous optic atrophy, unspecified eye: Secondary | ICD-10-CM | POA: Diagnosis not present

## 2013-12-28 ENCOUNTER — Encounter: Payer: Self-pay | Admitting: Internal Medicine

## 2014-03-24 DIAGNOSIS — H401290 Low-tension glaucoma, unspecified eye, stage unspecified: Secondary | ICD-10-CM | POA: Diagnosis not present

## 2014-03-24 DIAGNOSIS — H47239 Glaucomatous optic atrophy, unspecified eye: Secondary | ICD-10-CM | POA: Diagnosis not present

## 2014-04-18 DIAGNOSIS — H409 Unspecified glaucoma: Secondary | ICD-10-CM | POA: Diagnosis not present

## 2014-04-18 DIAGNOSIS — H401290 Low-tension glaucoma, unspecified eye, stage unspecified: Secondary | ICD-10-CM | POA: Diagnosis not present

## 2014-04-18 DIAGNOSIS — H5340 Unspecified visual field defects: Secondary | ICD-10-CM | POA: Diagnosis not present

## 2014-04-18 DIAGNOSIS — T887XXA Unspecified adverse effect of drug or medicament, initial encounter: Secondary | ICD-10-CM | POA: Diagnosis not present

## 2014-04-20 DIAGNOSIS — N390 Urinary tract infection, site not specified: Secondary | ICD-10-CM | POA: Diagnosis not present

## 2014-04-20 DIAGNOSIS — E039 Hypothyroidism, unspecified: Secondary | ICD-10-CM | POA: Diagnosis not present

## 2014-04-20 DIAGNOSIS — R635 Abnormal weight gain: Secondary | ICD-10-CM | POA: Diagnosis not present

## 2014-04-20 DIAGNOSIS — E785 Hyperlipidemia, unspecified: Secondary | ICD-10-CM | POA: Diagnosis not present

## 2014-05-18 DIAGNOSIS — H401290 Low-tension glaucoma, unspecified eye, stage unspecified: Secondary | ICD-10-CM | POA: Diagnosis not present

## 2014-05-18 DIAGNOSIS — H409 Unspecified glaucoma: Secondary | ICD-10-CM | POA: Diagnosis not present

## 2014-06-06 ENCOUNTER — Other Ambulatory Visit: Payer: Self-pay | Admitting: *Deleted

## 2014-06-06 DIAGNOSIS — I712 Thoracic aortic aneurysm, without rupture, unspecified: Secondary | ICD-10-CM

## 2014-06-15 ENCOUNTER — Other Ambulatory Visit: Payer: Self-pay | Admitting: *Deleted

## 2014-06-15 DIAGNOSIS — I712 Thoracic aortic aneurysm, without rupture, unspecified: Secondary | ICD-10-CM

## 2014-06-19 DIAGNOSIS — Z23 Encounter for immunization: Secondary | ICD-10-CM | POA: Diagnosis not present

## 2014-06-22 DIAGNOSIS — N3000 Acute cystitis without hematuria: Secondary | ICD-10-CM | POA: Diagnosis not present

## 2014-06-22 DIAGNOSIS — N3941 Urge incontinence: Secondary | ICD-10-CM | POA: Diagnosis not present

## 2014-07-05 ENCOUNTER — Other Ambulatory Visit: Payer: Medicare Other

## 2014-07-05 ENCOUNTER — Ambulatory Visit: Payer: Medicare Other | Admitting: Cardiothoracic Surgery

## 2014-07-07 DIAGNOSIS — N949 Unspecified condition associated with female genital organs and menstrual cycle: Secondary | ICD-10-CM | POA: Diagnosis not present

## 2014-07-07 DIAGNOSIS — R300 Dysuria: Secondary | ICD-10-CM | POA: Diagnosis not present

## 2014-08-30 DIAGNOSIS — H401213 Low-tension glaucoma, right eye, severe stage: Secondary | ICD-10-CM | POA: Diagnosis not present

## 2014-10-05 ENCOUNTER — Other Ambulatory Visit: Payer: Self-pay | Admitting: Cardiothoracic Surgery

## 2014-10-05 DIAGNOSIS — I712 Thoracic aortic aneurysm, without rupture: Secondary | ICD-10-CM | POA: Diagnosis not present

## 2014-10-05 LAB — BUN: BUN: 14 mg/dL (ref 6–23)

## 2014-10-05 LAB — CREATININE, SERUM: Creat: 0.85 mg/dL (ref 0.50–1.10)

## 2014-10-11 ENCOUNTER — Ambulatory Visit (INDEPENDENT_AMBULATORY_CARE_PROVIDER_SITE_OTHER): Payer: Medicare Other | Admitting: Cardiothoracic Surgery

## 2014-10-11 ENCOUNTER — Encounter: Payer: Self-pay | Admitting: Cardiothoracic Surgery

## 2014-10-11 ENCOUNTER — Ambulatory Visit
Admission: RE | Admit: 2014-10-11 | Discharge: 2014-10-11 | Disposition: A | Discharge status: Home / Self Care | Payer: Medicare Other | Source: Ambulatory Visit | Attending: Cardiothoracic Surgery | Admitting: Cardiothoracic Surgery

## 2014-10-11 VITALS — BP 107/75 | HR 90 | Resp 20 | Ht 64.0 in | Wt 156.0 lb

## 2014-10-11 DIAGNOSIS — I712 Thoracic aortic aneurysm, without rupture, unspecified: Secondary | ICD-10-CM

## 2014-10-11 MED ORDER — GADOBENATE DIMEGLUMINE 529 MG/ML IV SOLN
14.0000 mL | Freq: Once | INTRAVENOUS | Status: AC | PRN
  Administered 2014-10-11: 14 mL via INTRAVENOUS
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
## 2014-10-23 DIAGNOSIS — E785 Hyperlipidemia, unspecified: Secondary | ICD-10-CM | POA: Diagnosis not present

## 2014-11-01 DIAGNOSIS — H401223 Low-tension glaucoma, left eye, severe stage: Secondary | ICD-10-CM | POA: Diagnosis not present

## 2015-01-10 DIAGNOSIS — D229 Melanocytic nevi, unspecified: Secondary | ICD-10-CM | POA: Diagnosis not present

## 2015-01-10 DIAGNOSIS — L738 Other specified follicular disorders: Secondary | ICD-10-CM | POA: Diagnosis not present

## 2015-01-10 DIAGNOSIS — L570 Actinic keratosis: Secondary | ICD-10-CM | POA: Diagnosis not present

## 2015-01-10 DIAGNOSIS — D485 Neoplasm of uncertain behavior of skin: Secondary | ICD-10-CM | POA: Diagnosis not present

## 2015-01-12 DIAGNOSIS — C44511 Basal cell carcinoma of skin of breast: Secondary | ICD-10-CM | POA: Diagnosis not present

## 2015-01-31 DIAGNOSIS — C44319 Basal cell carcinoma of skin of other parts of face: Secondary | ICD-10-CM | POA: Diagnosis not present

## 2015-02-07 DIAGNOSIS — L821 Other seborrheic keratosis: Secondary | ICD-10-CM | POA: Diagnosis not present

## 2015-02-07 DIAGNOSIS — L820 Inflamed seborrheic keratosis: Secondary | ICD-10-CM | POA: Diagnosis not present

## 2015-02-07 DIAGNOSIS — L570 Actinic keratosis: Secondary | ICD-10-CM | POA: Diagnosis not present

## 2015-03-08 DIAGNOSIS — H401223 Low-tension glaucoma, left eye, severe stage: Secondary | ICD-10-CM | POA: Diagnosis not present

## 2015-03-25 DIAGNOSIS — N390 Urinary tract infection, site not specified: Secondary | ICD-10-CM | POA: Diagnosis not present

## 2015-04-06 DIAGNOSIS — H401233 Low-tension glaucoma, bilateral, severe stage: Secondary | ICD-10-CM | POA: Diagnosis not present

## 2015-05-14 DIAGNOSIS — Z1389 Encounter for screening for other disorder: Secondary | ICD-10-CM | POA: Diagnosis not present

## 2015-05-14 DIAGNOSIS — S50361A Insect bite (nonvenomous) of right elbow, initial encounter: Secondary | ICD-10-CM | POA: Diagnosis not present

## 2015-05-14 DIAGNOSIS — Z23 Encounter for immunization: Secondary | ICD-10-CM | POA: Diagnosis not present

## 2015-05-14 DIAGNOSIS — E039 Hypothyroidism, unspecified: Secondary | ICD-10-CM | POA: Diagnosis not present

## 2015-05-14 DIAGNOSIS — E785 Hyperlipidemia, unspecified: Secondary | ICD-10-CM | POA: Diagnosis not present

## 2015-07-12 DIAGNOSIS — G43909 Migraine, unspecified, not intractable, without status migrainosus: Secondary | ICD-10-CM | POA: Diagnosis not present

## 2015-07-12 DIAGNOSIS — H2513 Age-related nuclear cataract, bilateral: Secondary | ICD-10-CM | POA: Diagnosis not present

## 2015-07-12 DIAGNOSIS — H5213 Myopia, bilateral: Secondary | ICD-10-CM | POA: Diagnosis not present

## 2015-07-12 DIAGNOSIS — H401233 Low-tension glaucoma, bilateral, severe stage: Secondary | ICD-10-CM | POA: Diagnosis not present

## 2015-10-09 ENCOUNTER — Other Ambulatory Visit: Payer: Self-pay | Admitting: *Deleted

## 2015-10-09 DIAGNOSIS — I712 Thoracic aortic aneurysm, without rupture, unspecified: Secondary | ICD-10-CM

## 2015-10-22 DIAGNOSIS — H401213 Low-tension glaucoma, right eye, severe stage: Secondary | ICD-10-CM | POA: Diagnosis not present

## 2015-10-22 DIAGNOSIS — H401223 Low-tension glaucoma, left eye, severe stage: Secondary | ICD-10-CM | POA: Diagnosis not present

## 2015-10-26 ENCOUNTER — Other Ambulatory Visit: Payer: Self-pay | Admitting: Cardiothoracic Surgery

## 2015-10-26 DIAGNOSIS — I712 Thoracic aortic aneurysm, without rupture: Secondary | ICD-10-CM | POA: Diagnosis not present

## 2015-10-26 DIAGNOSIS — H5340 Unspecified visual field defects: Secondary | ICD-10-CM | POA: Diagnosis not present

## 2015-10-26 DIAGNOSIS — H401213 Low-tension glaucoma, right eye, severe stage: Secondary | ICD-10-CM | POA: Diagnosis not present

## 2015-10-26 DIAGNOSIS — H25813 Combined forms of age-related cataract, bilateral: Secondary | ICD-10-CM | POA: Diagnosis not present

## 2015-10-26 LAB — CREATININE, SERUM: Creat: 0.9 mg/dL (ref 0.60–0.93)

## 2015-10-31 ENCOUNTER — Ambulatory Visit
Admission: RE | Admit: 2015-10-31 | Discharge: 2015-10-31 | Disposition: A | Discharge status: Home / Self Care | Payer: Medicare Other | Source: Ambulatory Visit | Attending: Cardiothoracic Surgery | Admitting: Cardiothoracic Surgery

## 2015-10-31 ENCOUNTER — Ambulatory Visit (INDEPENDENT_AMBULATORY_CARE_PROVIDER_SITE_OTHER): Payer: Medicare Other | Admitting: Cardiothoracic Surgery

## 2015-10-31 ENCOUNTER — Encounter: Payer: Self-pay | Admitting: Cardiothoracic Surgery

## 2015-10-31 VITALS — BP 115/70 | HR 96 | Resp 20 | Ht 64.0 in | Wt 156.0 lb

## 2015-10-31 DIAGNOSIS — I712 Thoracic aortic aneurysm, without rupture, unspecified: Secondary | ICD-10-CM

## 2015-10-31 MED ORDER — GADOBENATE DIMEGLUMINE 529 MG/ML IV SOLN
14.0000 mL | Freq: Once | INTRAVENOUS | Status: AC | PRN
  Administered 2015-10-31: 14 mL via INTRAVENOUS
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
## 2015-11-03 ENCOUNTER — Ambulatory Visit (INDEPENDENT_AMBULATORY_CARE_PROVIDER_SITE_OTHER): Payer: Medicare Other | Admitting: Family Medicine

## 2015-11-03 VITALS — BP 100/60 | HR 84 | Temp 98.8°F | Resp 16 | Ht 63.25 in | Wt 152.0 lb

## 2015-11-03 DIAGNOSIS — R300 Dysuria: Secondary | ICD-10-CM | POA: Diagnosis not present

## 2015-11-03 DIAGNOSIS — N390 Urinary tract infection, site not specified: Secondary | ICD-10-CM | POA: Diagnosis not present

## 2015-11-03 LAB — POC MICROSCOPIC URINALYSIS (UMFC)

## 2015-11-03 LAB — POCT URINALYSIS DIP (MANUAL ENTRY)
Bilirubin, UA: NEGATIVE
Glucose, UA: NEGATIVE
Ketones, POC UA: NEGATIVE
Nitrite, UA: NEGATIVE
Protein Ur, POC: NEGATIVE
Spec Grav, UA: <=1.005
Urobilinogen, UA: 0.2
pH, UA: 5

## 2015-11-03 MED ORDER — CIPROFLOXACIN HCL 500 MG PO TABS: 500.0000 mg | ORAL_TABLET | Freq: Two times a day (BID) | ORAL | Status: DC
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
## 2015-11-03 NOTE — Patient Instructions (Signed)

## 2015-11-05 LAB — URINE CULTURE

## 2015-12-27 DIAGNOSIS — H401223 Low-tension glaucoma, left eye, severe stage: Secondary | ICD-10-CM | POA: Diagnosis not present

## 2015-12-27 DIAGNOSIS — H401213 Low-tension glaucoma, right eye, severe stage: Secondary | ICD-10-CM | POA: Diagnosis not present

## 2016-01-03 DIAGNOSIS — N8111 Cystocele, midline: Secondary | ICD-10-CM | POA: Diagnosis not present

## 2016-01-03 DIAGNOSIS — N952 Postmenopausal atrophic vaginitis: Secondary | ICD-10-CM | POA: Diagnosis not present

## 2016-01-11 DIAGNOSIS — N390 Urinary tract infection, site not specified: Secondary | ICD-10-CM | POA: Diagnosis not present

## 2016-01-11 DIAGNOSIS — M549 Dorsalgia, unspecified: Secondary | ICD-10-CM | POA: Diagnosis not present

## 2016-01-30 DIAGNOSIS — H401213 Low-tension glaucoma, right eye, severe stage: Secondary | ICD-10-CM | POA: Diagnosis not present

## 2016-02-28 DIAGNOSIS — N8110 Cystocele, unspecified: Secondary | ICD-10-CM | POA: Diagnosis not present

## 2016-02-28 DIAGNOSIS — N8189 Other female genital prolapse: Secondary | ICD-10-CM | POA: Diagnosis not present

## 2016-03-06 DIAGNOSIS — H401213 Low-tension glaucoma, right eye, severe stage: Secondary | ICD-10-CM | POA: Diagnosis not present

## 2016-03-06 DIAGNOSIS — H401223 Low-tension glaucoma, left eye, severe stage: Secondary | ICD-10-CM | POA: Diagnosis not present

## 2016-03-17 DIAGNOSIS — N8110 Cystocele, unspecified: Secondary | ICD-10-CM | POA: Diagnosis not present

## 2016-03-17 DIAGNOSIS — N8189 Other female genital prolapse: Secondary | ICD-10-CM | POA: Diagnosis not present

## 2016-03-26 DIAGNOSIS — N819 Female genital prolapse, unspecified: Secondary | ICD-10-CM | POA: Diagnosis not present

## 2016-04-29 DIAGNOSIS — E039 Hypothyroidism, unspecified: Secondary | ICD-10-CM | POA: Diagnosis not present

## 2016-04-29 DIAGNOSIS — Z1389 Encounter for screening for other disorder: Secondary | ICD-10-CM | POA: Diagnosis not present

## 2016-04-29 DIAGNOSIS — E785 Hyperlipidemia, unspecified: Secondary | ICD-10-CM | POA: Diagnosis not present

## 2016-06-02 DIAGNOSIS — H401133 Primary open-angle glaucoma, bilateral, severe stage: Secondary | ICD-10-CM | POA: Diagnosis not present

## 2016-06-02 DIAGNOSIS — Z23 Encounter for immunization: Secondary | ICD-10-CM | POA: Diagnosis not present

## 2016-06-02 DIAGNOSIS — H4720 Unspecified optic atrophy: Secondary | ICD-10-CM | POA: Diagnosis not present

## 2016-06-02 DIAGNOSIS — H5213 Myopia, bilateral: Secondary | ICD-10-CM | POA: Diagnosis not present

## 2016-06-04 DIAGNOSIS — N819 Female genital prolapse, unspecified: Secondary | ICD-10-CM | POA: Diagnosis not present

## 2016-06-23 DIAGNOSIS — H4720 Unspecified optic atrophy: Secondary | ICD-10-CM | POA: Diagnosis not present

## 2016-06-23 DIAGNOSIS — H2513 Age-related nuclear cataract, bilateral: Secondary | ICD-10-CM | POA: Diagnosis not present

## 2016-07-09 DIAGNOSIS — H401123 Primary open-angle glaucoma, left eye, severe stage: Secondary | ICD-10-CM | POA: Diagnosis not present

## 2016-07-09 DIAGNOSIS — H2513 Age-related nuclear cataract, bilateral: Secondary | ICD-10-CM | POA: Diagnosis not present

## 2016-07-09 DIAGNOSIS — H401113 Primary open-angle glaucoma, right eye, severe stage: Secondary | ICD-10-CM | POA: Diagnosis not present

## 2016-07-22 DIAGNOSIS — H401123 Primary open-angle glaucoma, left eye, severe stage: Secondary | ICD-10-CM | POA: Diagnosis not present

## 2016-07-22 DIAGNOSIS — H401113 Primary open-angle glaucoma, right eye, severe stage: Secondary | ICD-10-CM | POA: Diagnosis not present

## 2016-08-06 DIAGNOSIS — H401113 Primary open-angle glaucoma, right eye, severe stage: Secondary | ICD-10-CM | POA: Diagnosis not present

## 2016-08-06 DIAGNOSIS — H401123 Primary open-angle glaucoma, left eye, severe stage: Secondary | ICD-10-CM | POA: Diagnosis not present

## 2016-08-20 DIAGNOSIS — N819 Female genital prolapse, unspecified: Secondary | ICD-10-CM | POA: Diagnosis not present

## 2016-11-27 DIAGNOSIS — N819 Female genital prolapse, unspecified: Secondary | ICD-10-CM | POA: Diagnosis not present

## 2016-12-12 DIAGNOSIS — H401113 Primary open-angle glaucoma, right eye, severe stage: Secondary | ICD-10-CM | POA: Diagnosis not present

## 2016-12-12 DIAGNOSIS — H401123 Primary open-angle glaucoma, left eye, severe stage: Secondary | ICD-10-CM | POA: Diagnosis not present

## 2017-01-07 DIAGNOSIS — H401133 Primary open-angle glaucoma, bilateral, severe stage: Secondary | ICD-10-CM | POA: Diagnosis not present

## 2017-02-19 DIAGNOSIS — N819 Female genital prolapse, unspecified: Secondary | ICD-10-CM | POA: Diagnosis not present

## 2017-05-01 DIAGNOSIS — I712 Thoracic aortic aneurysm, without rupture: Secondary | ICD-10-CM | POA: Diagnosis not present

## 2017-05-01 DIAGNOSIS — Z0000 Encounter for general adult medical examination without abnormal findings: Secondary | ICD-10-CM | POA: Diagnosis not present

## 2017-05-01 DIAGNOSIS — E039 Hypothyroidism, unspecified: Secondary | ICD-10-CM | POA: Diagnosis not present

## 2017-05-01 DIAGNOSIS — E785 Hyperlipidemia, unspecified: Secondary | ICD-10-CM | POA: Diagnosis not present

## 2017-05-01 DIAGNOSIS — Z1389 Encounter for screening for other disorder: Secondary | ICD-10-CM | POA: Diagnosis not present

## 2017-05-13 DIAGNOSIS — N8189 Other female genital prolapse: Secondary | ICD-10-CM | POA: Diagnosis not present

## 2017-05-14 DIAGNOSIS — Z23 Encounter for immunization: Secondary | ICD-10-CM | POA: Diagnosis not present

## 2017-06-05 DIAGNOSIS — H401123 Primary open-angle glaucoma, left eye, severe stage: Secondary | ICD-10-CM | POA: Diagnosis not present

## 2017-06-05 DIAGNOSIS — H401112 Primary open-angle glaucoma, right eye, moderate stage: Secondary | ICD-10-CM | POA: Diagnosis not present

## 2017-07-09 DIAGNOSIS — R300 Dysuria: Secondary | ICD-10-CM | POA: Diagnosis not present

## 2017-07-09 DIAGNOSIS — N3001 Acute cystitis with hematuria: Secondary | ICD-10-CM | POA: Diagnosis not present

## 2017-07-09 DIAGNOSIS — H401112 Primary open-angle glaucoma, right eye, moderate stage: Secondary | ICD-10-CM | POA: Diagnosis not present

## 2017-07-09 DIAGNOSIS — H401123 Primary open-angle glaucoma, left eye, severe stage: Secondary | ICD-10-CM | POA: Diagnosis not present

## 2017-08-05 DIAGNOSIS — H52203 Unspecified astigmatism, bilateral: Secondary | ICD-10-CM | POA: Diagnosis not present

## 2017-08-05 DIAGNOSIS — H43813 Vitreous degeneration, bilateral: Secondary | ICD-10-CM | POA: Diagnosis not present

## 2017-08-05 DIAGNOSIS — H25813 Combined forms of age-related cataract, bilateral: Secondary | ICD-10-CM | POA: Diagnosis not present

## 2017-08-11 DIAGNOSIS — H268 Other specified cataract: Secondary | ICD-10-CM | POA: Diagnosis not present

## 2017-08-11 DIAGNOSIS — H25811 Combined forms of age-related cataract, right eye: Secondary | ICD-10-CM | POA: Diagnosis not present

## 2017-08-12 DIAGNOSIS — N390 Urinary tract infection, site not specified: Secondary | ICD-10-CM | POA: Diagnosis not present

## 2017-08-12 DIAGNOSIS — N8189 Other female genital prolapse: Secondary | ICD-10-CM | POA: Diagnosis not present

## 2017-09-01 DIAGNOSIS — H21562 Pupillary abnormality, left eye: Secondary | ICD-10-CM | POA: Diagnosis not present

## 2017-09-01 DIAGNOSIS — H25812 Combined forms of age-related cataract, left eye: Secondary | ICD-10-CM | POA: Diagnosis not present

## 2017-09-01 DIAGNOSIS — H268 Other specified cataract: Secondary | ICD-10-CM | POA: Diagnosis not present

## 2017-10-01 ENCOUNTER — Other Ambulatory Visit: Payer: Self-pay | Admitting: Cardiothoracic Surgery

## 2017-10-01 DIAGNOSIS — I712 Thoracic aortic aneurysm, without rupture, unspecified: Secondary | ICD-10-CM

## 2017-11-02 ENCOUNTER — Ambulatory Visit
Admission: RE | Admit: 2017-11-02 | Discharge: 2017-11-02 | Disposition: A | Discharge status: Home / Self Care | Payer: Medicare Other | Source: Ambulatory Visit | Attending: Cardiothoracic Surgery | Admitting: Cardiothoracic Surgery

## 2017-11-02 DIAGNOSIS — I712 Thoracic aortic aneurysm, without rupture, unspecified: Secondary | ICD-10-CM

## 2017-11-02 MED ORDER — GADOBENATE DIMEGLUMINE 529 MG/ML IV SOLN
13.0000 mL | Freq: Once | INTRAVENOUS | Status: AC | PRN
  Administered 2017-11-02: 13 mL via INTRAVENOUS

## 2017-11-04 ENCOUNTER — Ambulatory Visit: Payer: Medicare Other | Admitting: Cardiothoracic Surgery

## 2017-11-11 DIAGNOSIS — N819 Female genital prolapse, unspecified: Secondary | ICD-10-CM | POA: Diagnosis not present

## 2017-11-25 ENCOUNTER — Ambulatory Visit (INDEPENDENT_AMBULATORY_CARE_PROVIDER_SITE_OTHER): Payer: Medicare Other | Admitting: Cardiothoracic Surgery

## 2017-11-25 ENCOUNTER — Encounter: Payer: Self-pay | Admitting: Cardiothoracic Surgery

## 2017-11-25 VITALS — BP 124/80 | HR 72 | Resp 20 | Ht 63.25 in | Wt 141.0 lb

## 2017-11-25 DIAGNOSIS — I712 Thoracic aortic aneurysm, without rupture, unspecified: Secondary | ICD-10-CM
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
## 2018-01-11 DIAGNOSIS — H401112 Primary open-angle glaucoma, right eye, moderate stage: Secondary | ICD-10-CM | POA: Diagnosis not present

## 2018-01-11 DIAGNOSIS — H401123 Primary open-angle glaucoma, left eye, severe stage: Secondary | ICD-10-CM | POA: Diagnosis not present

## 2018-02-10 DIAGNOSIS — N819 Female genital prolapse, unspecified: Secondary | ICD-10-CM | POA: Diagnosis not present

## 2018-05-04 DIAGNOSIS — R42 Dizziness and giddiness: Secondary | ICD-10-CM | POA: Diagnosis not present

## 2018-05-04 DIAGNOSIS — Z1389 Encounter for screening for other disorder: Secondary | ICD-10-CM | POA: Diagnosis not present

## 2018-05-04 DIAGNOSIS — E785 Hyperlipidemia, unspecified: Secondary | ICD-10-CM | POA: Diagnosis not present

## 2018-05-04 DIAGNOSIS — E039 Hypothyroidism, unspecified: Secondary | ICD-10-CM | POA: Diagnosis not present

## 2018-05-04 DIAGNOSIS — I712 Thoracic aortic aneurysm, without rupture: Secondary | ICD-10-CM | POA: Diagnosis not present

## 2018-05-04 DIAGNOSIS — Z0000 Encounter for general adult medical examination without abnormal findings: Secondary | ICD-10-CM | POA: Diagnosis not present

## 2018-05-05 DIAGNOSIS — N819 Female genital prolapse, unspecified: Secondary | ICD-10-CM | POA: Diagnosis not present

## 2018-06-07 DIAGNOSIS — H401233 Low-tension glaucoma, bilateral, severe stage: Secondary | ICD-10-CM | POA: Diagnosis not present

## 2018-06-07 DIAGNOSIS — H26493 Other secondary cataract, bilateral: Secondary | ICD-10-CM | POA: Diagnosis not present

## 2018-06-07 DIAGNOSIS — Z961 Presence of intraocular lens: Secondary | ICD-10-CM | POA: Diagnosis not present

## 2018-06-07 DIAGNOSIS — H52203 Unspecified astigmatism, bilateral: Secondary | ICD-10-CM | POA: Diagnosis not present

## 2018-06-10 DIAGNOSIS — Z23 Encounter for immunization: Secondary | ICD-10-CM | POA: Diagnosis not present

## 2018-06-25 DIAGNOSIS — H401112 Primary open-angle glaucoma, right eye, moderate stage: Secondary | ICD-10-CM | POA: Diagnosis not present

## 2018-06-25 DIAGNOSIS — H401123 Primary open-angle glaucoma, left eye, severe stage: Secondary | ICD-10-CM | POA: Diagnosis not present

## 2018-07-05 DIAGNOSIS — H401123 Primary open-angle glaucoma, left eye, severe stage: Secondary | ICD-10-CM | POA: Diagnosis not present

## 2018-07-05 DIAGNOSIS — H401112 Primary open-angle glaucoma, right eye, moderate stage: Secondary | ICD-10-CM | POA: Diagnosis not present

## 2018-07-12 DIAGNOSIS — E039 Hypothyroidism, unspecified: Secondary | ICD-10-CM | POA: Diagnosis not present

## 2018-07-26 DIAGNOSIS — N819 Female genital prolapse, unspecified: Secondary | ICD-10-CM | POA: Diagnosis not present

## 2018-08-17 DIAGNOSIS — C44319 Basal cell carcinoma of skin of other parts of face: Secondary | ICD-10-CM | POA: Diagnosis not present

## 2018-08-17 DIAGNOSIS — Z23 Encounter for immunization: Secondary | ICD-10-CM | POA: Diagnosis not present

## 2018-08-17 DIAGNOSIS — D485 Neoplasm of uncertain behavior of skin: Secondary | ICD-10-CM | POA: Diagnosis not present

## 2018-10-11 DIAGNOSIS — C4491 Basal cell carcinoma of skin, unspecified: Secondary | ICD-10-CM | POA: Diagnosis not present

## 2018-10-11 DIAGNOSIS — N390 Urinary tract infection, site not specified: Secondary | ICD-10-CM | POA: Diagnosis not present

## 2018-10-11 DIAGNOSIS — E039 Hypothyroidism, unspecified: Secondary | ICD-10-CM | POA: Diagnosis not present

## 2018-10-14 DIAGNOSIS — C44319 Basal cell carcinoma of skin of other parts of face: Secondary | ICD-10-CM | POA: Diagnosis not present

## 2018-11-29 DIAGNOSIS — K5730 Diverticulosis of large intestine without perforation or abscess without bleeding: Secondary | ICD-10-CM | POA: Diagnosis not present

## 2018-11-29 DIAGNOSIS — D123 Benign neoplasm of transverse colon: Secondary | ICD-10-CM | POA: Diagnosis not present

## 2018-11-29 DIAGNOSIS — Z86010 Personal history of colonic polyps: Secondary | ICD-10-CM | POA: Diagnosis not present

## 2018-11-29 DIAGNOSIS — D122 Benign neoplasm of ascending colon: Secondary | ICD-10-CM | POA: Diagnosis not present

## 2018-11-29 DIAGNOSIS — Z8371 Family history of colonic polyps: Secondary | ICD-10-CM | POA: Diagnosis not present

## 2018-12-01 DIAGNOSIS — D123 Benign neoplasm of transverse colon: Secondary | ICD-10-CM | POA: Diagnosis not present

## 2018-12-01 DIAGNOSIS — D122 Benign neoplasm of ascending colon: Secondary | ICD-10-CM | POA: Diagnosis not present

## 2019-01-11 DIAGNOSIS — Z4689 Encounter for fitting and adjustment of other specified devices: Secondary | ICD-10-CM | POA: Diagnosis not present

## 2019-02-04 DIAGNOSIS — H26493 Other secondary cataract, bilateral: Secondary | ICD-10-CM | POA: Diagnosis not present

## 2019-02-04 DIAGNOSIS — H52203 Unspecified astigmatism, bilateral: Secondary | ICD-10-CM | POA: Diagnosis not present

## 2019-02-04 DIAGNOSIS — H04123 Dry eye syndrome of bilateral lacrimal glands: Secondary | ICD-10-CM | POA: Diagnosis not present

## 2019-02-04 DIAGNOSIS — H401133 Primary open-angle glaucoma, bilateral, severe stage: Secondary | ICD-10-CM | POA: Diagnosis not present

## 2019-02-23 DIAGNOSIS — H401112 Primary open-angle glaucoma, right eye, moderate stage: Secondary | ICD-10-CM | POA: Diagnosis not present

## 2019-02-23 DIAGNOSIS — H401123 Primary open-angle glaucoma, left eye, severe stage: Secondary | ICD-10-CM | POA: Diagnosis not present

## 2019-04-07 DIAGNOSIS — Z4689 Encounter for fitting and adjustment of other specified devices: Secondary | ICD-10-CM | POA: Diagnosis not present

## 2019-04-07 DIAGNOSIS — N819 Female genital prolapse, unspecified: Secondary | ICD-10-CM | POA: Diagnosis not present

## 2019-05-09 DIAGNOSIS — E785 Hyperlipidemia, unspecified: Secondary | ICD-10-CM | POA: Diagnosis not present

## 2019-05-09 DIAGNOSIS — I712 Thoracic aortic aneurysm, without rupture: Secondary | ICD-10-CM | POA: Diagnosis not present

## 2019-05-09 DIAGNOSIS — E441 Mild protein-calorie malnutrition: Secondary | ICD-10-CM | POA: Diagnosis not present

## 2019-05-09 DIAGNOSIS — Z23 Encounter for immunization: Secondary | ICD-10-CM | POA: Diagnosis not present

## 2019-05-09 DIAGNOSIS — H409 Unspecified glaucoma: Secondary | ICD-10-CM | POA: Diagnosis not present

## 2019-05-09 DIAGNOSIS — Z0000 Encounter for general adult medical examination without abnormal findings: Secondary | ICD-10-CM | POA: Diagnosis not present

## 2019-05-09 DIAGNOSIS — G4700 Insomnia, unspecified: Secondary | ICD-10-CM | POA: Diagnosis not present

## 2019-05-09 DIAGNOSIS — Z1389 Encounter for screening for other disorder: Secondary | ICD-10-CM | POA: Diagnosis not present

## 2019-05-09 DIAGNOSIS — E039 Hypothyroidism, unspecified: Secondary | ICD-10-CM | POA: Diagnosis not present

## 2019-06-21 DIAGNOSIS — L821 Other seborrheic keratosis: Secondary | ICD-10-CM | POA: Diagnosis not present

## 2019-06-21 DIAGNOSIS — D225 Melanocytic nevi of trunk: Secondary | ICD-10-CM | POA: Diagnosis not present

## 2019-06-21 DIAGNOSIS — W57XXXA Bitten or stung by nonvenomous insect and other nonvenomous arthropods, initial encounter: Secondary | ICD-10-CM | POA: Diagnosis not present

## 2019-06-21 DIAGNOSIS — L814 Other melanin hyperpigmentation: Secondary | ICD-10-CM | POA: Diagnosis not present

## 2019-06-21 DIAGNOSIS — B351 Tinea unguium: Secondary | ICD-10-CM | POA: Diagnosis not present

## 2019-06-21 DIAGNOSIS — Z23 Encounter for immunization: Secondary | ICD-10-CM | POA: Diagnosis not present

## 2019-06-21 DIAGNOSIS — Z85828 Personal history of other malignant neoplasm of skin: Secondary | ICD-10-CM | POA: Diagnosis not present

## 2019-06-21 DIAGNOSIS — L570 Actinic keratosis: Secondary | ICD-10-CM | POA: Diagnosis not present

## 2019-07-13 DIAGNOSIS — Z4689 Encounter for fitting and adjustment of other specified devices: Secondary | ICD-10-CM | POA: Diagnosis not present

## 2019-07-13 DIAGNOSIS — N819 Female genital prolapse, unspecified: Secondary | ICD-10-CM | POA: Diagnosis not present

## 2019-08-19 DIAGNOSIS — H401112 Primary open-angle glaucoma, right eye, moderate stage: Secondary | ICD-10-CM | POA: Diagnosis not present

## 2019-08-19 DIAGNOSIS — H401123 Primary open-angle glaucoma, left eye, severe stage: Secondary | ICD-10-CM | POA: Diagnosis not present

## 2019-08-24 DIAGNOSIS — N760 Acute vaginitis: Secondary | ICD-10-CM | POA: Diagnosis not present

## 2019-08-24 DIAGNOSIS — R309 Painful micturition, unspecified: Secondary | ICD-10-CM | POA: Diagnosis not present

## 2019-08-24 DIAGNOSIS — N390 Urinary tract infection, site not specified: Secondary | ICD-10-CM | POA: Diagnosis not present

## 2019-09-21 DIAGNOSIS — H401123 Primary open-angle glaucoma, left eye, severe stage: Secondary | ICD-10-CM | POA: Diagnosis not present

## 2019-09-21 DIAGNOSIS — H401112 Primary open-angle glaucoma, right eye, moderate stage: Secondary | ICD-10-CM | POA: Diagnosis not present

## 2019-09-28 ENCOUNTER — Other Ambulatory Visit: Payer: Self-pay | Admitting: Cardiothoracic Surgery

## 2019-09-28 DIAGNOSIS — I712 Thoracic aortic aneurysm, without rupture, unspecified: Secondary | ICD-10-CM

## 2019-10-05 DIAGNOSIS — N819 Female genital prolapse, unspecified: Secondary | ICD-10-CM | POA: Diagnosis not present

## 2019-11-10 DIAGNOSIS — H52203 Unspecified astigmatism, bilateral: Secondary | ICD-10-CM | POA: Diagnosis not present

## 2019-11-10 DIAGNOSIS — D3131 Benign neoplasm of right choroid: Secondary | ICD-10-CM | POA: Diagnosis not present

## 2019-11-10 DIAGNOSIS — H04123 Dry eye syndrome of bilateral lacrimal glands: Secondary | ICD-10-CM | POA: Diagnosis not present

## 2019-11-10 DIAGNOSIS — H401233 Low-tension glaucoma, bilateral, severe stage: Secondary | ICD-10-CM | POA: Diagnosis not present

## 2019-11-13 ENCOUNTER — Ambulatory Visit: Payer: Medicare Other | Attending: Internal Medicine

## 2019-11-13 DIAGNOSIS — Z23 Encounter for immunization: Secondary | ICD-10-CM | POA: Insufficient documentation

## 2019-11-13 NOTE — Progress Notes (Signed)
   Covid-19 Vaccination Clinic  Name:  AZYA CELIK    MRN: [id number] DOB: [birth year]  11/13/2019  Ms. Devereux was observed post Covid-19 immunization for 30 minutes based on pre-vaccination screening without incidence. She was provided with Vaccine Information Sheet and instruction to access the V-Safe system.   Ms. Janas was instructed to call 911 with any severe reactions post vaccine: Marland Kitchen Difficulty breathing  . Swelling of your face and throat  . A fast heartbeat  . A bad rash all over your body  . Dizziness and weakness    Immunizations Administered    Name Date Dose VIS Date Route   Pfizer COVID-19 Vaccine 11/13/2019  9:56 AM 0.3 mL 08/26/2019 Intramuscular   Manufacturer: [place]   Lot: [id number]   [place]: [phone]

## 2019-11-25 ENCOUNTER — Other Ambulatory Visit: Payer: Self-pay

## 2019-11-25 ENCOUNTER — Ambulatory Visit
Admission: RE | Admit: 2019-11-25 | Discharge: 2019-11-25 | Disposition: A | Discharge status: Home / Self Care | Payer: Medicare Other | Source: Ambulatory Visit | Attending: Cardiothoracic Surgery | Admitting: Cardiothoracic Surgery

## 2019-11-25 DIAGNOSIS — I712 Thoracic aortic aneurysm, without rupture, unspecified: Secondary | ICD-10-CM

## 2019-11-25 MED ORDER — GADOBENATE DIMEGLUMINE 529 MG/ML IV SOLN
12.0000 mL | Freq: Once | INTRAVENOUS | Status: AC | PRN
  Administered 2019-11-25: 12 mL via INTRAVENOUS

## 2019-11-28 ENCOUNTER — Other Ambulatory Visit: Payer: Medicare Other

## 2019-11-30 ENCOUNTER — Encounter: Payer: Self-pay | Admitting: Cardiothoracic Surgery

## 2019-11-30 ENCOUNTER — Other Ambulatory Visit: Payer: Self-pay

## 2019-11-30 ENCOUNTER — Ambulatory Visit (INDEPENDENT_AMBULATORY_CARE_PROVIDER_SITE_OTHER): Payer: Medicare Other | Admitting: Cardiothoracic Surgery

## 2019-11-30 VITALS — BP 115/74 | HR 82 | Temp 98.1°F | Resp 16 | Ht 63.25 in | Wt 133.0 lb

## 2019-11-30 DIAGNOSIS — M314 Aortic arch syndrome [Takayasu]: Secondary | ICD-10-CM

## 2019-11-30 DIAGNOSIS — I712 Thoracic aortic aneurysm, without rupture, unspecified: Secondary | ICD-10-CM
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
## 2019-12-07 ENCOUNTER — Ambulatory Visit: Payer: Medicare Other | Attending: Internal Medicine

## 2019-12-07 DIAGNOSIS — Z23 Encounter for immunization: Secondary | ICD-10-CM

## 2019-12-07 NOTE — Progress Notes (Signed)
   Covid-19 Vaccination Clinic  Name:  [name]    MRN: [id number] DOB: [birth year]  12/07/2019  [name] was observed post Covid-19 immunization for 15 minutes without incident. She was provided with Vaccine Information Sheet and instruction to access the V-Safe system.   [name] was instructed to call 911 with any severe reactions post vaccine: Marland Kitchen Difficulty breathing  . Swelling of face and throat  . A fast heartbeat  . A bad rash all over body  . Dizziness and weakness   Immunizations Administered    Name Date Dose VIS Date Route   Pfizer COVID-19 Vaccine 12/07/2019 12:09 PM 0.3 mL 08/26/2019 Intramuscular   Manufacturer: [place]   Lot: [id number]   [place]: [id number]

## 2020-01-04 DIAGNOSIS — N952 Postmenopausal atrophic vaginitis: Secondary | ICD-10-CM | POA: Diagnosis not present

## 2020-01-26 ENCOUNTER — Other Ambulatory Visit: Payer: Self-pay | Admitting: Family Medicine

## 2020-01-26 DIAGNOSIS — Z1231 Encounter for screening mammogram for malignant neoplasm of breast: Secondary | ICD-10-CM

## 2020-02-29 ENCOUNTER — Ambulatory Visit
Admission: RE | Admit: 2020-02-29 | Discharge: 2020-02-29 | Disposition: A | Discharge status: Home / Self Care | Payer: Medicare Other | Source: Ambulatory Visit | Attending: Family Medicine | Admitting: Family Medicine

## 2020-02-29 ENCOUNTER — Other Ambulatory Visit: Payer: Self-pay

## 2020-02-29 DIAGNOSIS — Z1231 Encounter for screening mammogram for malignant neoplasm of breast: Secondary | ICD-10-CM

## 2020-05-04 DIAGNOSIS — H6121 Impacted cerumen, right ear: Secondary | ICD-10-CM | POA: Diagnosis not present

## 2020-05-04 DIAGNOSIS — H6983 Other specified disorders of Eustachian tube, bilateral: Secondary | ICD-10-CM | POA: Diagnosis not present

## 2020-05-04 DIAGNOSIS — J310 Chronic rhinitis: Secondary | ICD-10-CM | POA: Diagnosis not present

## 2020-05-09 DIAGNOSIS — E785 Hyperlipidemia, unspecified: Secondary | ICD-10-CM | POA: Diagnosis not present

## 2020-05-09 DIAGNOSIS — I712 Thoracic aortic aneurysm, without rupture: Secondary | ICD-10-CM | POA: Diagnosis not present

## 2020-05-09 DIAGNOSIS — Z0000 Encounter for general adult medical examination without abnormal findings: Secondary | ICD-10-CM | POA: Diagnosis not present

## 2020-05-09 DIAGNOSIS — Z1389 Encounter for screening for other disorder: Secondary | ICD-10-CM | POA: Diagnosis not present

## 2020-05-09 DIAGNOSIS — H409 Unspecified glaucoma: Secondary | ICD-10-CM | POA: Diagnosis not present

## 2020-05-09 DIAGNOSIS — E441 Mild protein-calorie malnutrition: Secondary | ICD-10-CM | POA: Diagnosis not present

## 2020-05-09 DIAGNOSIS — E039 Hypothyroidism, unspecified: Secondary | ICD-10-CM | POA: Diagnosis not present

## 2020-06-06 DIAGNOSIS — N819 Female genital prolapse, unspecified: Secondary | ICD-10-CM | POA: Diagnosis not present

## 2020-06-06 DIAGNOSIS — Z4689 Encounter for fitting and adjustment of other specified devices: Secondary | ICD-10-CM | POA: Diagnosis not present

## 2020-06-15 DIAGNOSIS — Z23 Encounter for immunization: Secondary | ICD-10-CM | POA: Diagnosis not present

## 2020-06-19 DIAGNOSIS — H903 Sensorineural hearing loss, bilateral: Secondary | ICD-10-CM | POA: Diagnosis not present

## 2020-06-19 DIAGNOSIS — J310 Chronic rhinitis: Secondary | ICD-10-CM | POA: Diagnosis not present

## 2020-06-19 DIAGNOSIS — H9193 Unspecified hearing loss, bilateral: Secondary | ICD-10-CM | POA: Diagnosis not present

## 2020-06-20 DIAGNOSIS — D225 Melanocytic nevi of trunk: Secondary | ICD-10-CM | POA: Diagnosis not present

## 2020-06-20 DIAGNOSIS — L820 Inflamed seborrheic keratosis: Secondary | ICD-10-CM | POA: Diagnosis not present

## 2020-06-20 DIAGNOSIS — Z85828 Personal history of other malignant neoplasm of skin: Secondary | ICD-10-CM | POA: Diagnosis not present

## 2020-06-20 DIAGNOSIS — L578 Other skin changes due to chronic exposure to nonionizing radiation: Secondary | ICD-10-CM | POA: Diagnosis not present

## 2020-06-20 DIAGNOSIS — R309 Painful micturition, unspecified: Secondary | ICD-10-CM | POA: Diagnosis not present

## 2020-06-20 DIAGNOSIS — L821 Other seborrheic keratosis: Secondary | ICD-10-CM | POA: Diagnosis not present

## 2020-06-20 DIAGNOSIS — L814 Other melanin hyperpigmentation: Secondary | ICD-10-CM | POA: Diagnosis not present

## 2020-06-30 ENCOUNTER — Ambulatory Visit: Payer: Medicare Other | Attending: Internal Medicine

## 2020-06-30 DIAGNOSIS — Z23 Encounter for immunization: Secondary | ICD-10-CM

## 2020-06-30 NOTE — Progress Notes (Signed)
° °  Covid-19 Vaccination Clinic  Name:  [name]    MRN: [id number] DOB: 01/23/1946  06/30/2020  [name] was observed post Covid-19 immunization for 15 minutes without incident. She was provided with Vaccine Information Sheet and instruction to access the V-Safe system.   [name] was instructed to call 911 with any severe reactions post vaccine:  Difficulty breathing   Swelling of face and throat   A fast heartbeat   A bad rash all over body   Dizziness and weakness

## 2020-08-22 DIAGNOSIS — Z01419 Encounter for gynecological examination (general) (routine) without abnormal findings: Secondary | ICD-10-CM | POA: Diagnosis not present

## 2020-08-22 DIAGNOSIS — Z6822 Body mass index (BMI) 22.0-22.9, adult: Secondary | ICD-10-CM | POA: Diagnosis not present

## 2020-08-22 DIAGNOSIS — N390 Urinary tract infection, site not specified: Secondary | ICD-10-CM | POA: Diagnosis not present

## 2020-08-22 DIAGNOSIS — Z4689 Encounter for fitting and adjustment of other specified devices: Secondary | ICD-10-CM | POA: Diagnosis not present

## 2020-08-22 DIAGNOSIS — R3989 Other symptoms and signs involving the genitourinary system: Secondary | ICD-10-CM | POA: Diagnosis not present

## 2020-10-09 DIAGNOSIS — Z23 Encounter for immunization: Secondary | ICD-10-CM | POA: Diagnosis not present

## 2020-10-11 DIAGNOSIS — H401123 Primary open-angle glaucoma, left eye, severe stage: Secondary | ICD-10-CM | POA: Diagnosis not present

## 2020-10-11 DIAGNOSIS — H401112 Primary open-angle glaucoma, right eye, moderate stage: Secondary | ICD-10-CM | POA: Diagnosis not present

## 2020-10-12 DIAGNOSIS — H401123 Primary open-angle glaucoma, left eye, severe stage: Secondary | ICD-10-CM | POA: Diagnosis not present

## 2020-10-12 DIAGNOSIS — H401112 Primary open-angle glaucoma, right eye, moderate stage: Secondary | ICD-10-CM | POA: Diagnosis not present

## 2020-10-31 DIAGNOSIS — Z4689 Encounter for fitting and adjustment of other specified devices: Secondary | ICD-10-CM | POA: Diagnosis not present

## 2020-10-31 DIAGNOSIS — N819 Female genital prolapse, unspecified: Secondary | ICD-10-CM | POA: Diagnosis not present

## 2021-01-08 DIAGNOSIS — N819 Female genital prolapse, unspecified: Secondary | ICD-10-CM | POA: Diagnosis not present

## 2021-01-16 DIAGNOSIS — H401233 Low-tension glaucoma, bilateral, severe stage: Secondary | ICD-10-CM | POA: Diagnosis not present

## 2021-01-16 DIAGNOSIS — H52203 Unspecified astigmatism, bilateral: Secondary | ICD-10-CM | POA: Diagnosis not present

## 2021-01-16 DIAGNOSIS — D3131 Benign neoplasm of right choroid: Secondary | ICD-10-CM | POA: Diagnosis not present

## 2021-01-16 DIAGNOSIS — H43813 Vitreous degeneration, bilateral: Secondary | ICD-10-CM | POA: Diagnosis not present

## 2021-02-12 ENCOUNTER — Other Ambulatory Visit (HOSPITAL_BASED_OUTPATIENT_CLINIC_OR_DEPARTMENT_OTHER): Payer: Self-pay

## 2021-02-12 ENCOUNTER — Other Ambulatory Visit: Payer: Self-pay

## 2021-02-12 ENCOUNTER — Ambulatory Visit: Payer: Medicare Other | Attending: Internal Medicine

## 2021-02-12 DIAGNOSIS — Z23 Encounter for immunization: Secondary | ICD-10-CM

## 2021-02-12 MED ORDER — PFIZER-BIONT COVID-19 VAC-TRIS 30 MCG/0.3ML IM SUSP
INTRAMUSCULAR | 0 refills | Status: AC
  Filled 2021-02-12: qty 0.3, 1d supply, fill #0

## 2021-02-12 NOTE — Progress Notes (Signed)
   Covid-19 Vaccination Clinic  Name:  METHA KOLASA    MRN: [id number] DOB: Feb 11, 1946  02/12/2021  Ms. Dehner was observed post Covid-19 immunization for 15 minutes without incident. She was provided with Vaccine Information Sheet and instruction to access the V-Safe system.   Ms. Sills was instructed to call 911 with any severe reactions post vaccine: Marland Kitchen Difficulty breathing  . Swelling of face and throat  . A fast heartbeat  . A bad rash all over body  . Dizziness and weakness   Immunizations Administered    Name Date Dose VIS Date Route   PFIZER Comrnaty([place]) Covid-19 Vaccine 02/12/2021 12:56 PM 0.3 mL 08/23/2020 Intramuscular   Manufacturer: [place]   Lot: [id number]   [place]: [phone]

## 2021-03-08 ENCOUNTER — Ambulatory Visit
Admission: EM | Admit: 2021-03-08 | Discharge: 2021-03-08 | Disposition: A | Discharge status: Home / Self Care | Payer: Medicare Other | Attending: Emergency Medicine | Admitting: Emergency Medicine

## 2021-03-08 ENCOUNTER — Encounter: Payer: Self-pay | Admitting: Emergency Medicine

## 2021-03-08 ENCOUNTER — Other Ambulatory Visit: Payer: Self-pay

## 2021-03-08 DIAGNOSIS — B351 Tinea unguium: Secondary | ICD-10-CM | POA: Insufficient documentation

## 2021-03-08 DIAGNOSIS — N3000 Acute cystitis without hematuria: Secondary | ICD-10-CM | POA: Insufficient documentation

## 2021-03-08 LAB — POCT URINALYSIS DIP (MANUAL ENTRY)
Bilirubin, UA: NEGATIVE
Glucose, UA: 100 mg/dL — AB
Ketones, POC UA: NEGATIVE mg/dL
Nitrite, UA: NEGATIVE
Protein Ur, POC: NEGATIVE mg/dL
Spec Grav, UA: 1.01 (ref 1.010–1.025)
Urobilinogen, UA: 0.2 E.U./dL — AB
pH, UA: 6.5 (ref 5.0–8.0)

## 2021-03-08 MED ORDER — CEPHALEXIN 500 MG PO CAPS: 500.0000 mg | ORAL_CAPSULE | Freq: Two times a day (BID) | ORAL | 0 refills | Status: AC

## 2021-03-08 MED ORDER — CICLOPIROX 8 % EX SOLN: Freq: Every day | CUTANEOUS | 0 refills | Status: AC
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
## 2021-03-08 NOTE — Discharge Instructions (Addendum)
Urine showed evidence of infection. We are treating you with keflex- twice daily x 1 week . Be sure to take full course. Stay hydrated- urine should be pale yellow to clear. My continue azo for relief of burning while infection is being cleared.   Please return or follow up with your primary provider if symptoms not improving with treatment. Please return sooner if you have worsening of symptoms or develop fever, nausea, vomiting, abdominal pain, back pain, lightheadedness, dizziness.

## 2021-03-08 NOTE — ED Triage Notes (Addendum)
Patient c/o dysuria and chills x 2 days.   Patient endorses foul odor to urine. Patient endorses "burning with peeing".   Patient denies any ABD pain or back pain. Patient denies fever at home.  Patient hasn't taken any medications for symptoms.   Patient c/o RT "big toe" nail fungus x 2 weeks.   Patient has erythema present to toe.   Patient denies pain.   Patient endorses toe discoloration.   Patient has tried OTC medication with no relief of symptoms.

## 2021-03-10 LAB — URINE CULTURE: Culture: 30000 — AB

## 2021-04-11 DIAGNOSIS — N819 Female genital prolapse, unspecified: Secondary | ICD-10-CM | POA: Diagnosis not present

## 2021-04-17 DIAGNOSIS — H401123 Primary open-angle glaucoma, left eye, severe stage: Secondary | ICD-10-CM | POA: Diagnosis not present

## 2021-04-17 DIAGNOSIS — H401112 Primary open-angle glaucoma, right eye, moderate stage: Secondary | ICD-10-CM | POA: Diagnosis not present

## 2021-05-11 ENCOUNTER — Ambulatory Visit
Admission: EM | Admit: 2021-05-11 | Discharge: 2021-05-11 | Disposition: A | Discharge status: Home / Self Care | Payer: Medicare Other | Attending: Internal Medicine | Admitting: Internal Medicine

## 2021-05-11 ENCOUNTER — Encounter: Payer: Self-pay | Admitting: General Practice

## 2021-05-11 ENCOUNTER — Other Ambulatory Visit: Payer: Self-pay

## 2021-05-11 DIAGNOSIS — R300 Dysuria: Secondary | ICD-10-CM | POA: Insufficient documentation

## 2021-05-11 DIAGNOSIS — N3001 Acute cystitis with hematuria: Secondary | ICD-10-CM | POA: Diagnosis not present

## 2021-05-11 DIAGNOSIS — R350 Frequency of micturition: Secondary | ICD-10-CM | POA: Insufficient documentation

## 2021-05-11 LAB — POCT URINALYSIS DIP (MANUAL ENTRY)
Glucose, UA: >=1000 mg/dL — AB
Ketones, POC UA: NEGATIVE mg/dL
Nitrite, UA: NEGATIVE
Protein Ur, POC: 100 mg/dL — AB
Spec Grav, UA: >=1.03 — AB (ref 1.010–1.025)
Urobilinogen, UA: 0.2 E.U./dL
pH, UA: 5.5 (ref 5.0–8.0)

## 2021-05-11 MED ORDER — CIPROFLOXACIN HCL 500 MG PO TABS: 250.0000 mg | ORAL_TABLET | Freq: Two times a day (BID) | ORAL | 0 refills | Status: AC
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
## 2021-05-11 NOTE — ED Triage Notes (Signed)
Thinks she got a "uti, looks like there is blood in it"

## 2021-05-11 NOTE — ED Triage Notes (Addendum)
New onset back pain

## 2021-05-11 NOTE — Discharge Instructions (Addendum)
Your urine does show signs of UTI. Will treat with cipro antibiotic. Please refrain from any strenuous activity until antibiotic is complete. Urine culture is pending. We will call if this is positive. Please go to the hospital if symptoms do not improve in the next 24-48 hours.

## 2021-05-12 LAB — URINE CULTURE

## 2021-05-29 DIAGNOSIS — Z1389 Encounter for screening for other disorder: Secondary | ICD-10-CM | POA: Diagnosis not present

## 2021-05-29 DIAGNOSIS — E441 Mild protein-calorie malnutrition: Secondary | ICD-10-CM | POA: Diagnosis not present

## 2021-05-29 DIAGNOSIS — E785 Hyperlipidemia, unspecified: Secondary | ICD-10-CM | POA: Diagnosis not present

## 2021-05-29 DIAGNOSIS — I712 Thoracic aortic aneurysm, without rupture: Secondary | ICD-10-CM | POA: Diagnosis not present

## 2021-05-29 DIAGNOSIS — Z0000 Encounter for general adult medical examination without abnormal findings: Secondary | ICD-10-CM | POA: Diagnosis not present

## 2021-05-29 DIAGNOSIS — Z23 Encounter for immunization: Secondary | ICD-10-CM | POA: Diagnosis not present

## 2021-05-29 DIAGNOSIS — H409 Unspecified glaucoma: Secondary | ICD-10-CM | POA: Diagnosis not present

## 2021-05-29 DIAGNOSIS — E039 Hypothyroidism, unspecified: Secondary | ICD-10-CM | POA: Diagnosis not present

## 2021-06-13 DIAGNOSIS — Z4689 Encounter for fitting and adjustment of other specified devices: Secondary | ICD-10-CM | POA: Diagnosis not present

## 2021-06-13 DIAGNOSIS — N819 Female genital prolapse, unspecified: Secondary | ICD-10-CM | POA: Diagnosis not present

## 2021-07-03 DIAGNOSIS — N390 Urinary tract infection, site not specified: Secondary | ICD-10-CM | POA: Diagnosis not present

## 2021-07-03 DIAGNOSIS — R309 Painful micturition, unspecified: Secondary | ICD-10-CM | POA: Diagnosis not present

## 2021-07-06 DIAGNOSIS — Z23 Encounter for immunization: Secondary | ICD-10-CM | POA: Diagnosis not present

## 2021-07-16 DIAGNOSIS — E039 Hypothyroidism, unspecified: Secondary | ICD-10-CM | POA: Diagnosis not present

## 2021-07-19 DIAGNOSIS — H401112 Primary open-angle glaucoma, right eye, moderate stage: Secondary | ICD-10-CM | POA: Diagnosis not present

## 2021-07-19 DIAGNOSIS — H401123 Primary open-angle glaucoma, left eye, severe stage: Secondary | ICD-10-CM | POA: Diagnosis not present

## 2021-07-22 ENCOUNTER — Other Ambulatory Visit: Payer: Self-pay | Admitting: Family Medicine

## 2021-07-22 DIAGNOSIS — Z1231 Encounter for screening mammogram for malignant neoplasm of breast: Secondary | ICD-10-CM

## 2021-08-13 DIAGNOSIS — N819 Female genital prolapse, unspecified: Secondary | ICD-10-CM | POA: Diagnosis not present

## 2021-08-29 DIAGNOSIS — N390 Urinary tract infection, site not specified: Secondary | ICD-10-CM | POA: Diagnosis not present

## 2021-08-29 DIAGNOSIS — N819 Female genital prolapse, unspecified: Secondary | ICD-10-CM | POA: Diagnosis not present

## 2021-08-30 DIAGNOSIS — N812 Incomplete uterovaginal prolapse: Secondary | ICD-10-CM | POA: Diagnosis not present

## 2021-09-10 ENCOUNTER — Ambulatory Visit
Admission: RE | Admit: 2021-09-10 | Discharge: 2021-09-10 | Disposition: A | Discharge status: Home / Self Care | Payer: Medicare Other | Source: Ambulatory Visit | Attending: Family Medicine | Admitting: Family Medicine

## 2021-09-10 ENCOUNTER — Other Ambulatory Visit: Payer: Self-pay

## 2021-09-10 DIAGNOSIS — Z1231 Encounter for screening mammogram for malignant neoplasm of breast: Secondary | ICD-10-CM | POA: Diagnosis not present

## 2021-09-27 DIAGNOSIS — R350 Frequency of micturition: Secondary | ICD-10-CM | POA: Diagnosis not present

## 2021-09-27 DIAGNOSIS — N819 Female genital prolapse, unspecified: Secondary | ICD-10-CM | POA: Diagnosis not present

## 2021-12-10 DIAGNOSIS — N819 Female genital prolapse, unspecified: Secondary | ICD-10-CM | POA: Diagnosis not present

## 2021-12-16 DIAGNOSIS — H401112 Primary open-angle glaucoma, right eye, moderate stage: Secondary | ICD-10-CM | POA: Diagnosis not present

## 2021-12-16 DIAGNOSIS — H401123 Primary open-angle glaucoma, left eye, severe stage: Secondary | ICD-10-CM | POA: Diagnosis not present

## 2021-12-18 DIAGNOSIS — B351 Tinea unguium: Secondary | ICD-10-CM | POA: Diagnosis not present

## 2021-12-18 DIAGNOSIS — R519 Headache, unspecified: Secondary | ICD-10-CM | POA: Diagnosis not present

## 2021-12-23 DIAGNOSIS — R519 Headache, unspecified: Secondary | ICD-10-CM | POA: Diagnosis not present

## 2022-01-27 DIAGNOSIS — H401123 Primary open-angle glaucoma, left eye, severe stage: Secondary | ICD-10-CM | POA: Diagnosis not present

## 2022-01-27 DIAGNOSIS — H401112 Primary open-angle glaucoma, right eye, moderate stage: Secondary | ICD-10-CM | POA: Diagnosis not present

## 2022-01-28 ENCOUNTER — Ambulatory Visit (INDEPENDENT_AMBULATORY_CARE_PROVIDER_SITE_OTHER): Payer: Medicare Other | Admitting: Neurology

## 2022-01-28 ENCOUNTER — Encounter: Payer: Self-pay | Admitting: Neurology

## 2022-01-28 ENCOUNTER — Telehealth: Payer: Self-pay | Admitting: Neurology

## 2022-01-28 ENCOUNTER — Other Ambulatory Visit: Payer: Self-pay | Admitting: Neurology

## 2022-01-28 VITALS — BP 100/65 | HR 65 | Ht 64.0 in | Wt 134.2 lb

## 2022-01-28 DIAGNOSIS — G43009 Migraine without aura, not intractable, without status migrainosus: Secondary | ICD-10-CM

## 2022-01-28 DIAGNOSIS — H5712 Ocular pain, left eye: Secondary | ICD-10-CM

## 2022-01-28 DIAGNOSIS — G43109 Migraine with aura, not intractable, without status migrainosus: Secondary | ICD-10-CM

## 2022-01-28 DIAGNOSIS — H5460 Unqualified visual loss, one eye, unspecified: Secondary | ICD-10-CM | POA: Diagnosis not present

## 2022-01-28 DIAGNOSIS — R519 Headache, unspecified: Secondary | ICD-10-CM | POA: Diagnosis not present

## 2022-01-28 DIAGNOSIS — G8929 Other chronic pain: Secondary | ICD-10-CM

## 2022-01-28 MED ORDER — UBRELVY 100 MG PO TABS: 100.0000 mg | ORAL_TABLET | ORAL | 11 refills | Status: AC | PRN

## 2022-01-28 MED ORDER — ATOGEPANT 60 MG PO TABS: 60.0000 mg | ORAL_TABLET | Freq: Every day | ORAL | 11 refills | Status: DC

## 2022-01-28 NOTE — Telephone Encounter (Signed)
medicare/aarp NPR sent to GI and call patient to schedule ?

## 2022-01-28 NOTE — Patient Instructions (Addendum)
Start [name] daily for prevention - every day ?[name] as needed for migraine: Please take one tablet at the onset of your headache. If it does not improve the symptoms please take one additional tablet in 2 hours. Do not take more then 2 tablets in 24hrs.  ?Will call about the MRI ?Bloodwork ? ?Ubrogepant Tablets ?What is this medication? ?UBROGEPANT (ue BROE je pant) treats migraines. It works by blocking a substance in the body that causes migraines. It is not used to prevent migraines. ?This medicine may be used for other purposes; ask your health care provider or pharmacist if you have questions. ?COMMON BRAND NAME(S): Ubrelvy ?What should I tell my care team before I take this medication? ?They need to know if you have any of these conditions: ?Kidney disease ?Liver disease ?An unusual or allergic reaction to ubrogepant, other medications, foods, dyes, or preservatives ?Pregnant or trying to get pregnant ?Breast-feeding ?How should I use this medication? ?Take this medication by mouth with a glass of water. Take it as directed on the prescription label. You can take it with or without food. If it upsets your stomach, take it with food. Keep taking it unless your care team tells you to stop. ?Talk to your care team about the use of this medication in children. Special care may be needed. ?Overdosage: If you think you have taken too much of this medicine contact a poison control center or emergency room at once. ?NOTE: This medicine is only for you. Do not share this medicine with others. ?What if I miss a dose? ?This does not apply. This medication is not for regular use. ?What may interact with this medication? ?Do not take this medication with any of the following: ?Adagrasib ?Ceritinib ?Certain antibiotics, such as chloramphenicol, clarithromycin, telithromycin ?Certain antivirals for HIV, such as atazanavir, cobicistat, darunavir, delavirdine, fosamprenavir, indinavir, ritonavir ?Certain medications for  fungal infections, such as itraconazole, ketoconazole, posaconazole, voriconazole ?Conivaptan ?Grapefruit ?Idelalisib ?Mifepristone ?Nefazodone ?Ribociclib ?This medication may also interact with the following: ?Carvedilol ?Certain medications for seizures, such as phenobarbital, phenytoin ?Ciprofloxacin ?Cyclosporine ?Eltrombopag ?Fluconazole ?Fluvoxamine ?Quinidine ?Rifampin ?St. John's wort ?Verapamil ?This list may not describe all possible interactions. Give your health care provider a list of all the medicines, herbs, non-prescription drugs, or dietary supplements you use. Also tell them if you smoke, drink alcohol, or use illegal drugs. Some items may interact with your medicine. ?What should I watch for while using this medication? ?Visit your care team for regular checks on your progress. Tell your care team if your symptoms do not start to get better or if they get worse. ?Your mouth may get dry. Chewing sugarless gum or sucking hard candy and drinking plenty of water may help. Contact your care team if the problem does not go away or is severe. ?What side effects may I notice from receiving this medication? ?Side effects that you should report to your care team as soon as possible: ?Allergic reactions--skin rash, itching, hives, swelling of the face, lips, tongue, or throat ?Side effects that usually do not require medical attention (report to your care team if they continue or are bothersome): ?Drowsiness ?Dry mouth ?Fatigue ?Nausea ?This list may not describe all possible side effects. Call your doctor for medical advice about side effects. You may report side effects to FDA at 1-800-FDA-1088. ?Where should I keep my medication? ?Keep out of the reach of children and pets. ?Store between 15 and 30 degrees C (59 and 86 degrees F). Get rid of any  unused medication after the expiration date. ?To get rid of medications that are no longer needed or have expired: ?Take the medication to a medication take-back  program. Check with your pharmacy or law enforcement to find a location. ?If you cannot return the medication, check the label or package insert to see if the medication should be thrown out in the garbage or flushed down the toilet. If you are not sure, ask your care team. If it is safe to put it in the trash, pour the medication out of the container. Mix the medication with cat litter, dirt, coffee grounds, or other unwanted substance. Seal the mixture in a bag or container. Put it in the trash. ?NOTE: This sheet is a summary. It may not cover all possible information. If you have questions about this medicine, talk to your doctor, pharmacist, or health care provider. ?? 2023 Elsevier/Gold Standard (2021-10-25 00:00:00) ?Atogepant Tablets ?What is this medication? ?ATOGEPANT (a TOE je pant) prevents migraines. It works by blocking a substance in the body that causes migraines. ?This medicine may be used for other purposes; ask your health care provider or pharmacist if you have questions. ?COMMON BRAND NAME(S): QULIPTA ?What should I tell my care team before I take this medication? ?They need to know if you have any of these conditions: ?Kidney disease ?Liver disease ?An unusual or allergic reaction to atogepant, other medications, foods, dyes, or preservatives ?Pregnant or trying to get pregnant ?Breast-feeding ?How should I use this medication? ?Take this medication by mouth with water. Take it as directed on the prescription label at the same time every day. You can take it with or without food. If it upsets your stomach, take it with food. Keep taking it unless your care team tells you to stop. ?Talk to your care team about the use of this medication in children. Special care may be needed. ?Overdosage: If you think you have taken too much of this medicine contact a poison control center or emergency room at once. ?NOTE: This medicine is only for you. Do not share this medicine with others. ?What if I miss a  dose? ?If you miss a dose, take it as soon as you can. If it is almost time for your next dose, take only that dose. Do not take double or extra doses. ?What may interact with this medication? ?Carbamazepine ?Certain medications for fungal infections, such as itraconazole, ketoconazole ?Clarithromycin ?Cyclosporine ?Efavirenz ?Etravirine ?Phenytoin ?Rifampin ?St. John's wort ?This list may not describe all possible interactions. Give your health care provider a list of all the medicines, herbs, non-prescription drugs, or dietary supplements you use. Also tell them if you smoke, drink alcohol, or use illegal drugs. Some items may interact with your medicine. ?What should I watch for while using this medication? ?Visit your care team for regular checks on your progress. Tell your care team if your symptoms do not start to get better or if they get worse. ?What side effects may I notice from receiving this medication? ?Side effects that you should report to your care team as soon as possible: ?Allergic reactions--skin rash, itching, hives, swelling of the face, lips, tongue, or throat ?Side effects that usually do not require medical attention (report to your care team if they continue or are bothersome): ?Constipation ?Fatigue ?Loss of appetite with weight loss ?Nausea ?This list may not describe all possible side effects. Call your doctor for medical advice about side effects. You may report side effects to FDA at 1-800-FDA-1088. ?Where  should I keep my medication? ?Keep out of the reach of children and pets. ?Store at room temperature between 20 and 25 degrees C (68 and 77 degrees F). Get rid of any unused medication after the expiration date. ?To get rid of medications that are no longer needed or have expired: ?Take the medication to a medication take-back program. Check with your pharmacy or law enforcement to find a location. ?If you cannot return the medication, check the label or package insert to see if the  medication should be thrown out in the garbage or flushed down the toilet. If you are not sure, ask your care team. If it is safe to put it in the trash, take the medication out of the container. Mix the
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
## 2022-01-29 ENCOUNTER — Telehealth: Payer: Self-pay | Admitting: *Deleted

## 2022-01-29 LAB — BASIC METABOLIC PANEL
BUN/Creatinine Ratio: 15 (ref 12–28)
BUN: 15 mg/dL (ref 8–27)
CO2: 22 mmol/L (ref 20–29)
Calcium: 9.3 mg/dL (ref 8.7–10.3)
Chloride: 107 mmol/L — ABNORMAL HIGH (ref 96–106)
Creatinine, Ser: 0.97 mg/dL (ref 0.57–1.00)
Glucose: 87 mg/dL (ref 70–99)
Potassium: 4.5 mmol/L (ref 3.5–5.2)
Sodium: 143 mmol/L (ref 134–144)
eGFR: 60 mL/min/{1.73_m2} (ref >59)

## 2022-01-29 LAB — SEDIMENTATION RATE: Sed Rate: 2 mm/hr (ref 0–40)

## 2022-01-29 LAB — C-REACTIVE PROTEIN: CRP: <1 mg/L (ref 0–10)

## 2022-01-29 NOTE — Telephone Encounter (Signed)
Received request from [facility] insurance not cover qulipta.  I redid  rx to [facility].  I explained to pt that they would be in touch within 24 hours. [phone].  They would do PA for this and send her medication in mail.  They will call her for information (her MCR supp information) so to be on the look out.  She verbalized understanding.  I did ask if I should call jennifer to let her know and pt said no.  I will call [facility] as well to give them information as well (if that may help).   ?

## 2022-01-29 NOTE — Telephone Encounter (Signed)
-----   Message from Melvenia Beam, MD sent at 01/29/2022  1:01 PM EDT ----- ?Blood work looks fine thanks ?

## 2022-01-29 NOTE — Telephone Encounter (Signed)
Spoke to patient gave labwork results . Pt asked about MRI informed when referral is sent they have to run her insurance and after that they will call her for a appointment . Informed pt it may not be until end of week  to get a call back pt expressed understanding and thanked me for calling  ?

## 2022-02-06 ENCOUNTER — Telehealth: Payer: Self-pay | Admitting: Neurology

## 2022-02-06 ENCOUNTER — Other Ambulatory Visit: Payer: Self-pay | Admitting: Neurology

## 2022-02-06 MED ORDER — ALPRAZOLAM 0.25 MG PO TABS: ORAL_TABLET | ORAL | 0 refills | Status: AC

## 2022-02-06 NOTE — Telephone Encounter (Signed)
Spoke with the patient that Dr. Jaynee Eagles ordered low-dose Xanax for her.  We discussed the instructions, take 1 to 2 tablets 30 - 60 minutes before the MRI.  May repeat if needed.  Patient is aware not to drive while she is taking the Xanax.  She states her daughter will be driving her.  [facility] [address].  Her questions were answered during the call.  She verbalized appreciation.

## 2022-02-06 NOTE — Telephone Encounter (Signed)
Pt said, MRI scheduled 02/14/22, want to know if medication for claustrophobic has been sent to the pharmacy.  Also do I take the Quilpta the night before MRI? Would like a call from the nurse.

## 2022-02-11 DIAGNOSIS — N819 Female genital prolapse, unspecified: Secondary | ICD-10-CM | POA: Diagnosis not present

## 2022-02-14 ENCOUNTER — Ambulatory Visit
Admission: RE | Admit: 2022-02-14 | Discharge: 2022-02-14 | Disposition: A | Discharge status: Home / Self Care | Payer: Medicare Other | Source: Ambulatory Visit | Attending: Neurology | Admitting: Neurology

## 2022-02-14 DIAGNOSIS — H5460 Unqualified visual loss, one eye, unspecified: Secondary | ICD-10-CM

## 2022-02-14 DIAGNOSIS — H5712 Ocular pain, left eye: Secondary | ICD-10-CM

## 2022-02-14 DIAGNOSIS — R519 Headache, unspecified: Secondary | ICD-10-CM

## 2022-02-14 DIAGNOSIS — G8929 Other chronic pain: Secondary | ICD-10-CM

## 2022-02-14 MED ORDER — GADOBENATE DIMEGLUMINE 529 MG/ML IV SOLN
12.0000 mL | Freq: Once | INTRAVENOUS | Status: AC | PRN
  Administered 2022-02-14: 12 mL via INTRAVENOUS

## 2022-02-17 ENCOUNTER — Telehealth: Payer: Self-pay | Admitting: *Deleted

## 2022-02-17 NOTE — Telephone Encounter (Signed)
Spoke to patient gave MRI brain results . Pt verified telephone appointment June 12th with Dr. Jaynee Eagles. Pt expressed understanding and thanked me for calling

## 2022-02-17 NOTE — Telephone Encounter (Signed)
Denyse Salek Key: [id number] - PA Case ID: [id number]  PA Qulipta complete waiting on approval

## 2022-02-17 NOTE — Telephone Encounter (Signed)
-----   Message from Melvenia Beam, MD sent at 02/15/2022 11:23 PM EDT ----- MRI of the brain normal for age, Carolin Coy

## 2022-02-18 NOTE — Telephone Encounter (Signed)
PA Lenoria Chime was denied . Reference # [id number]. [name] is the preferred by patients insurance . Will forward to Dr Jaynee Eagles to make her aware

## 2022-02-18 NOTE — Telephone Encounter (Signed)
Spoke to patient wants to wait before trying Nurtec . Pt states will discuss other options Monday at her appointment with Dr.Ahern.

## 2022-02-18 NOTE — Addendum Note (Signed)
Addended by: Skeet Simmer A on: 02/18/2022 12:02 PM   Modules accepted: Orders

## 2022-02-24 ENCOUNTER — Telehealth: Payer: Medicare Other | Admitting: Neurology

## 2022-03-03 ENCOUNTER — Encounter: Payer: Self-pay | Admitting: Neurology

## 2022-03-03 ENCOUNTER — Ambulatory Visit (INDEPENDENT_AMBULATORY_CARE_PROVIDER_SITE_OTHER): Payer: Medicare Other | Admitting: Neurology

## 2022-03-03 ENCOUNTER — Telehealth: Payer: Self-pay | Admitting: Neurology

## 2022-03-03 DIAGNOSIS — G43109 Migraine with aura, not intractable, without status migrainosus: Secondary | ICD-10-CM | POA: Diagnosis not present

## 2022-03-03 DIAGNOSIS — G43009 Migraine without aura, not intractable, without status migrainosus: Secondary | ICD-10-CM | POA: Diagnosis not present
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
## 2022-03-03 NOTE — Telephone Encounter (Signed)
Guy Begin, which you mind calling patient and scheduling her for follow-up in the office with an NP please, Jinny Blossom or Judson Roch or Amy thanks.  4 months.

## 2022-04-08 DIAGNOSIS — N819 Female genital prolapse, unspecified: Secondary | ICD-10-CM | POA: Diagnosis not present

## 2022-06-04 DIAGNOSIS — N819 Female genital prolapse, unspecified: Secondary | ICD-10-CM | POA: Diagnosis not present

## 2022-07-02 DIAGNOSIS — H401112 Primary open-angle glaucoma, right eye, moderate stage: Secondary | ICD-10-CM | POA: Diagnosis not present

## 2022-07-02 DIAGNOSIS — H401123 Primary open-angle glaucoma, left eye, severe stage: Secondary | ICD-10-CM | POA: Diagnosis not present

## 2022-07-02 DIAGNOSIS — Z961 Presence of intraocular lens: Secondary | ICD-10-CM | POA: Diagnosis not present

## 2022-07-08 ENCOUNTER — Ambulatory Visit: Payer: Medicare Other | Admitting: Family Medicine

## 2022-07-16 DIAGNOSIS — J329 Chronic sinusitis, unspecified: Secondary | ICD-10-CM | POA: Diagnosis not present

## 2022-07-16 DIAGNOSIS — H9202 Otalgia, left ear: Secondary | ICD-10-CM | POA: Diagnosis not present

## 2022-07-18 DIAGNOSIS — I712 Thoracic aortic aneurysm, without rupture, unspecified: Secondary | ICD-10-CM | POA: Diagnosis not present

## 2022-07-18 DIAGNOSIS — E785 Hyperlipidemia, unspecified: Secondary | ICD-10-CM | POA: Diagnosis not present

## 2022-07-18 DIAGNOSIS — Z1331 Encounter for screening for depression: Secondary | ICD-10-CM | POA: Diagnosis not present

## 2022-07-18 DIAGNOSIS — N390 Urinary tract infection, site not specified: Secondary | ICD-10-CM | POA: Diagnosis not present

## 2022-07-18 DIAGNOSIS — Z0000 Encounter for general adult medical examination without abnormal findings: Secondary | ICD-10-CM | POA: Diagnosis not present

## 2022-07-18 DIAGNOSIS — E039 Hypothyroidism, unspecified: Secondary | ICD-10-CM | POA: Diagnosis not present

## 2022-07-18 DIAGNOSIS — H409 Unspecified glaucoma: Secondary | ICD-10-CM | POA: Diagnosis not present

## 2022-07-18 DIAGNOSIS — E441 Mild protein-calorie malnutrition: Secondary | ICD-10-CM | POA: Diagnosis not present

## 2022-07-30 DIAGNOSIS — R300 Dysuria: Secondary | ICD-10-CM | POA: Diagnosis not present

## 2022-07-30 DIAGNOSIS — N819 Female genital prolapse, unspecified: Secondary | ICD-10-CM | POA: Diagnosis not present

## 2022-08-03 DIAGNOSIS — Z23 Encounter for immunization: Secondary | ICD-10-CM | POA: Diagnosis not present

## 2022-08-06 ENCOUNTER — Other Ambulatory Visit: Payer: Self-pay | Admitting: Family Medicine

## 2022-08-06 DIAGNOSIS — Z1231 Encounter for screening mammogram for malignant neoplasm of breast: Secondary | ICD-10-CM

## 2022-09-13 DIAGNOSIS — Z23 Encounter for immunization: Secondary | ICD-10-CM | POA: Diagnosis not present

## 2022-10-02 ENCOUNTER — Other Ambulatory Visit (HOSPITAL_BASED_OUTPATIENT_CLINIC_OR_DEPARTMENT_OTHER): Payer: Self-pay

## 2022-10-08 DIAGNOSIS — R300 Dysuria: Secondary | ICD-10-CM | POA: Diagnosis not present

## 2022-10-14 ENCOUNTER — Ambulatory Visit: Payer: PRIVATE HEALTH INSURANCE

## 2022-10-14 DIAGNOSIS — U071 COVID-19: Secondary | ICD-10-CM | POA: Diagnosis not present

## 2022-11-07 DIAGNOSIS — N390 Urinary tract infection, site not specified: Secondary | ICD-10-CM | POA: Diagnosis not present

## 2022-11-07 DIAGNOSIS — N819 Female genital prolapse, unspecified: Secondary | ICD-10-CM | POA: Diagnosis not present

## 2022-12-31 DIAGNOSIS — N819 Female genital prolapse, unspecified: Secondary | ICD-10-CM | POA: Diagnosis not present

## 2022-12-31 DIAGNOSIS — H401123 Primary open-angle glaucoma, left eye, severe stage: Secondary | ICD-10-CM | POA: Diagnosis not present

## 2023-01-07 ENCOUNTER — Ambulatory Visit: Payer: Medicare Other | Admitting: Family Medicine

## 2023-02-27 DIAGNOSIS — E039 Hypothyroidism, unspecified: Secondary | ICD-10-CM | POA: Diagnosis not present

## 2023-02-27 DIAGNOSIS — N819 Female genital prolapse, unspecified: Secondary | ICD-10-CM | POA: Diagnosis not present

## 2023-05-13 DIAGNOSIS — N819 Female genital prolapse, unspecified: Secondary | ICD-10-CM | POA: Diagnosis not present

## 2023-06-17 DIAGNOSIS — H401112 Primary open-angle glaucoma, right eye, moderate stage: Secondary | ICD-10-CM | POA: Diagnosis not present

## 2023-06-17 DIAGNOSIS — H401123 Primary open-angle glaucoma, left eye, severe stage: Secondary | ICD-10-CM | POA: Diagnosis not present

## 2023-06-30 DIAGNOSIS — C44329 Squamous cell carcinoma of skin of other parts of face: Secondary | ICD-10-CM | POA: Diagnosis not present

## 2023-06-30 DIAGNOSIS — D485 Neoplasm of uncertain behavior of skin: Secondary | ICD-10-CM | POA: Diagnosis not present

## 2023-07-12 DIAGNOSIS — Z23 Encounter for immunization: Secondary | ICD-10-CM | POA: Diagnosis not present

## 2023-07-19 DIAGNOSIS — Z23 Encounter for immunization: Secondary | ICD-10-CM | POA: Diagnosis not present

## 2023-07-24 DIAGNOSIS — N819 Female genital prolapse, unspecified: Secondary | ICD-10-CM | POA: Diagnosis not present

## 2023-07-31 DIAGNOSIS — R29890 Loss of height: Secondary | ICD-10-CM | POA: Diagnosis not present

## 2023-07-31 DIAGNOSIS — E039 Hypothyroidism, unspecified: Secondary | ICD-10-CM | POA: Diagnosis not present

## 2023-07-31 DIAGNOSIS — R252 Cramp and spasm: Secondary | ICD-10-CM | POA: Diagnosis not present

## 2023-07-31 DIAGNOSIS — Z1331 Encounter for screening for depression: Secondary | ICD-10-CM | POA: Diagnosis not present

## 2023-07-31 DIAGNOSIS — E785 Hyperlipidemia, unspecified: Secondary | ICD-10-CM | POA: Diagnosis not present

## 2023-07-31 DIAGNOSIS — N8111 Cystocele, midline: Secondary | ICD-10-CM | POA: Diagnosis not present

## 2023-07-31 DIAGNOSIS — H409 Unspecified glaucoma: Secondary | ICD-10-CM | POA: Diagnosis not present

## 2023-07-31 DIAGNOSIS — Z0000 Encounter for general adult medical examination without abnormal findings: Secondary | ICD-10-CM | POA: Diagnosis not present

## 2023-07-31 DIAGNOSIS — I712 Thoracic aortic aneurysm, without rupture, unspecified: Secondary | ICD-10-CM | POA: Diagnosis not present

## 2023-07-31 DIAGNOSIS — E441 Mild protein-calorie malnutrition: Secondary | ICD-10-CM | POA: Diagnosis not present

## 2023-08-05 DIAGNOSIS — C44329 Squamous cell carcinoma of skin of other parts of face: Secondary | ICD-10-CM | POA: Diagnosis not present

## 2023-08-26 DIAGNOSIS — Z5189 Encounter for other specified aftercare: Secondary | ICD-10-CM | POA: Diagnosis not present

## 2023-09-18 DIAGNOSIS — N819 Female genital prolapse, unspecified: Secondary | ICD-10-CM | POA: Diagnosis not present

## 2023-09-18 DIAGNOSIS — Z4689 Encounter for fitting and adjustment of other specified devices: Secondary | ICD-10-CM | POA: Diagnosis not present

## 2023-11-11 DIAGNOSIS — N819 Female genital prolapse, unspecified: Secondary | ICD-10-CM | POA: Diagnosis not present

## 2023-11-11 DIAGNOSIS — Z6825 Body mass index (BMI) 25.0-25.9, adult: Secondary | ICD-10-CM | POA: Diagnosis not present

## 2023-11-11 DIAGNOSIS — Z01419 Encounter for gynecological examination (general) (routine) without abnormal findings: Secondary | ICD-10-CM | POA: Diagnosis not present

## 2023-12-16 DIAGNOSIS — H401112 Primary open-angle glaucoma, right eye, moderate stage: Secondary | ICD-10-CM | POA: Diagnosis not present

## 2023-12-16 DIAGNOSIS — H401123 Primary open-angle glaucoma, left eye, severe stage: Secondary | ICD-10-CM | POA: Diagnosis not present

## 2023-12-16 DIAGNOSIS — Z961 Presence of intraocular lens: Secondary | ICD-10-CM | POA: Diagnosis not present

## 2023-12-16 DIAGNOSIS — H52203 Unspecified astigmatism, bilateral: Secondary | ICD-10-CM | POA: Diagnosis not present

## 2023-12-16 DIAGNOSIS — H524 Presbyopia: Secondary | ICD-10-CM | POA: Diagnosis not present

## 2024-01-26 ENCOUNTER — Ambulatory Visit
Admission: RE | Admit: 2024-01-26 | Discharge: 2024-01-26 | Disposition: A | Discharge status: Home / Self Care | Source: Ambulatory Visit | Attending: Family Medicine | Admitting: Family Medicine

## 2024-01-26 ENCOUNTER — Other Ambulatory Visit: Payer: Self-pay | Admitting: Family Medicine

## 2024-01-26 DIAGNOSIS — Z83719 Family history of colon polyps, unspecified: Secondary | ICD-10-CM | POA: Diagnosis not present

## 2024-01-26 DIAGNOSIS — R319 Hematuria, unspecified: Secondary | ICD-10-CM | POA: Diagnosis not present

## 2024-01-26 DIAGNOSIS — Z1231 Encounter for screening mammogram for malignant neoplasm of breast: Secondary | ICD-10-CM | POA: Diagnosis not present

## 2024-01-26 DIAGNOSIS — N819 Female genital prolapse, unspecified: Secondary | ICD-10-CM | POA: Diagnosis not present

## 2024-01-26 DIAGNOSIS — Z860101 Personal history of adenomatous and serrated colon polyps: Secondary | ICD-10-CM | POA: Diagnosis not present

## 2024-02-17 DIAGNOSIS — Z09 Encounter for follow-up examination after completed treatment for conditions other than malignant neoplasm: Secondary | ICD-10-CM | POA: Diagnosis not present

## 2024-02-17 DIAGNOSIS — D128 Benign neoplasm of rectum: Secondary | ICD-10-CM | POA: Diagnosis not present

## 2024-02-17 DIAGNOSIS — Z860100 Personal history of colon polyps, unspecified: Secondary | ICD-10-CM | POA: Diagnosis not present

## 2024-02-17 DIAGNOSIS — K5730 Diverticulosis of large intestine without perforation or abscess without bleeding: Secondary | ICD-10-CM | POA: Diagnosis not present

## 2024-02-19 DIAGNOSIS — D128 Benign neoplasm of rectum: Secondary | ICD-10-CM | POA: Diagnosis not present

## 2024-04-05 DIAGNOSIS — N819 Female genital prolapse, unspecified: Secondary | ICD-10-CM | POA: Diagnosis not present

## 2024-06-07 DIAGNOSIS — N819 Female genital prolapse, unspecified: Secondary | ICD-10-CM | POA: Diagnosis not present

## 2024-06-15 DIAGNOSIS — H401123 Primary open-angle glaucoma, left eye, severe stage: Secondary | ICD-10-CM | POA: Diagnosis not present

## 2024-06-15 DIAGNOSIS — Z961 Presence of intraocular lens: Secondary | ICD-10-CM | POA: Diagnosis not present

## 2024-06-15 DIAGNOSIS — H401112 Primary open-angle glaucoma, right eye, moderate stage: Secondary | ICD-10-CM | POA: Diagnosis not present

## 2024-07-05 DIAGNOSIS — Z23 Encounter for immunization: Secondary | ICD-10-CM | POA: Diagnosis not present

## 2024-07-19 DIAGNOSIS — Z85828 Personal history of other malignant neoplasm of skin: Secondary | ICD-10-CM | POA: Diagnosis not present

## 2024-07-19 DIAGNOSIS — L578 Other skin changes due to chronic exposure to nonionizing radiation: Secondary | ICD-10-CM | POA: Diagnosis not present

## 2024-07-19 DIAGNOSIS — L821 Other seborrheic keratosis: Secondary | ICD-10-CM | POA: Diagnosis not present

## 2024-07-19 DIAGNOSIS — D225 Melanocytic nevi of trunk: Secondary | ICD-10-CM | POA: Diagnosis not present

## 2024-07-19 DIAGNOSIS — D485 Neoplasm of uncertain behavior of skin: Secondary | ICD-10-CM | POA: Diagnosis not present

## 2024-07-19 DIAGNOSIS — L814 Other melanin hyperpigmentation: Secondary | ICD-10-CM | POA: Diagnosis not present

## 2024-07-19 DIAGNOSIS — L820 Inflamed seborrheic keratosis: Secondary | ICD-10-CM | POA: Diagnosis not present

## 2024-08-02 DIAGNOSIS — Z4689 Encounter for fitting and adjustment of other specified devices: Secondary | ICD-10-CM | POA: Diagnosis not present

## 2024-08-02 DIAGNOSIS — N819 Female genital prolapse, unspecified: Secondary | ICD-10-CM | POA: Diagnosis not present

## 2024-08-16 DIAGNOSIS — E441 Mild protein-calorie malnutrition: Secondary | ICD-10-CM | POA: Diagnosis not present

## 2024-08-16 DIAGNOSIS — Z23 Encounter for immunization: Secondary | ICD-10-CM | POA: Diagnosis not present

## 2024-08-16 DIAGNOSIS — H409 Unspecified glaucoma: Secondary | ICD-10-CM | POA: Diagnosis not present

## 2024-08-16 DIAGNOSIS — N8111 Cystocele, midline: Secondary | ICD-10-CM | POA: Diagnosis not present

## 2024-08-16 DIAGNOSIS — Z1331 Encounter for screening for depression: Secondary | ICD-10-CM | POA: Diagnosis not present

## 2024-08-16 DIAGNOSIS — I712 Thoracic aortic aneurysm, without rupture, unspecified: Secondary | ICD-10-CM | POA: Diagnosis not present

## 2024-08-16 DIAGNOSIS — E785 Hyperlipidemia, unspecified: Secondary | ICD-10-CM | POA: Diagnosis not present

## 2024-08-16 DIAGNOSIS — Z0000 Encounter for general adult medical examination without abnormal findings: Secondary | ICD-10-CM | POA: Diagnosis not present

## 2024-08-16 DIAGNOSIS — E039 Hypothyroidism, unspecified: Secondary | ICD-10-CM | POA: Diagnosis not present
